# Patient Record
Sex: Female | Born: 1970
Health system: Southern US, Community
[De-identification: ages and names within clinical notes are randomized; demographics above are authoritative.]

## PROBLEM LIST (undated history)

## (undated) DIAGNOSIS — I8390 Asymptomatic varicose veins of unspecified lower extremity: Secondary | ICD-10-CM

## (undated) DIAGNOSIS — D252 Subserosal leiomyoma of uterus: Secondary | ICD-10-CM

## (undated) DIAGNOSIS — L80 Vitiligo: Secondary | ICD-10-CM

## (undated) DIAGNOSIS — R001 Bradycardia, unspecified: Secondary | ICD-10-CM

## (undated) HISTORY — DX: Subserosal leiomyoma of uterus: D25.2

## (undated) HISTORY — PX: WISDOM TOOTH EXTRACTION: SHX21

## (undated) HISTORY — PX: GANGLION CYST EXCISION: SHX1691

## (undated) HISTORY — DX: Vitiligo: L80

## (undated) HISTORY — DX: Bradycardia, unspecified: R00.1

## (undated) HISTORY — DX: Asymptomatic varicose veins of unspecified lower extremity: I83.90

---

## 2006-08-20 ENCOUNTER — Inpatient Hospital Stay (HOSPITAL_COMMUNITY): Admission: AD | Admit: 2006-08-20 | Discharge: 2006-08-20 | Payer: Self-pay | Admitting: Obstetrics and Gynecology

## 2006-08-20 ENCOUNTER — Inpatient Hospital Stay (HOSPITAL_COMMUNITY): Admission: AD | Admit: 2006-08-20 | Discharge: 2006-08-22 | Payer: Self-pay | Admitting: Obstetrics and Gynecology

## 2007-04-11 ENCOUNTER — Emergency Department (HOSPITAL_COMMUNITY): Admission: EM | Admit: 2007-04-11 | Discharge: 2007-04-11 | Payer: Self-pay | Admitting: Emergency Medicine

## 2007-10-27 ENCOUNTER — Ambulatory Visit: Payer: Self-pay | Admitting: Family Medicine

## 2008-03-28 ENCOUNTER — Ambulatory Visit: Payer: Self-pay | Admitting: Family Medicine

## 2009-03-17 ENCOUNTER — Ambulatory Visit: Payer: Self-pay | Admitting: Family Medicine

## 2009-04-06 ENCOUNTER — Ambulatory Visit (HOSPITAL_BASED_OUTPATIENT_CLINIC_OR_DEPARTMENT_OTHER): Admission: RE | Admit: 2009-04-06 | Discharge: 2009-04-06 | Payer: Self-pay | Admitting: Orthopedic Surgery

## 2009-04-06 ENCOUNTER — Encounter (INDEPENDENT_AMBULATORY_CARE_PROVIDER_SITE_OTHER): Payer: Self-pay | Admitting: Orthopedic Surgery

## 2010-05-07 ENCOUNTER — Ambulatory Visit (HOSPITAL_COMMUNITY): Admission: RE | Admit: 2010-05-07 | Discharge: 2010-05-07 | Payer: Self-pay | Admitting: Obstetrics and Gynecology

## 2010-05-10 ENCOUNTER — Encounter: Admission: RE | Admit: 2010-05-10 | Discharge: 2010-05-10 | Payer: Self-pay | Admitting: Internal Medicine

## 2010-11-24 LAB — POCT HEMOGLOBIN-HEMACUE: Hemoglobin: 13.3 g/dL (ref 12.0–15.0)

## 2011-01-01 NOTE — Op Note (Signed)
NAMESABAH, ZUCCO NO.:  [id number]   MEDICAL RECORD NO.:  [id number]          PATIENT TYPE:  AMB   LOCATION:  DSC                          FACILITY:  [facility]   PHYSICIAN:  Cindee Salt, M.D.       DATE OF BIRTH:  03-31-1971   DATE OF PROCEDURE:  04/06/2009  DATE OF DISCHARGE:                               OPERATIVE REPORT   PREOPERATIVE DIAGNOSIS:  Dorsal mass left wrist probable cyst.   POSTOPERATIVE DIAGNOSIS:  Dorsal mass left wrist probable cyst.   OPERATION:  Excision cystic mass left wrist.   SURGEON:  Cindee Salt, MD   ASSISTANT:  Carolyne Fiscal R.N.   ANESTHESIA:  Local with regional IV.   ANESTHESIOLOGIST:  Janetta Hora. Frederick, MD.   HISTORY:  The patient is a 40 year old female with a history of mass on  the dorsal aspect of her left wrist.  She is desirous having this  excised.  Pre, peri and postoperative course have been discussed along  with the risks and complication.  She is aware that there is no  guarantee with surgery, possibility of infection, recurrence injury to  arteries, nerves, tendons, incomplete relief of symptoms, and dystrophy.  Preoperative area the patient is seen.  The extremity marked by both the  patient and surgeon.  Antibiotic given.   PROCEDURE:  The patient was brought to the operating room where a  forearm based IV regional anesthetic was carried out without difficulty  under the direction of Dr. Gelene Mink.  She was prepped using ChloraPrep,  supine position, left arm free.  Three minute dry time was allowed and  time-out taken confirming the patient and procedure.  A long transverse  incision was made over the dorsal aspect of the left wrist third, fourth  dorsal compartment and carried down through subcutaneous tissue.  Extensor retinaculum was split and neurovascular structures protected.  Bleeders were electrocauterized with bipolar.  The cystic mass was  immediately apparent.  This was multilobulated.  With blunt and  sharp  dissection, it was dissected free and found to arise from the  scapholunate ligament area.  This area was opened and it was debrided  and there was a large polypoid type mass.  This was sent to pathology in  toto.  The wound was copiously irrigated with saline.  The capsule was  then repaired with figure-of-eight 4-0 Vicryl sutures suturing it back  down to the dorsal ligament.  The retinaculum was then repaired with  interrupted 4-0 Vicryl, the subcutaneous tissue with interrupted 4-0  Vicryl and the skin with a subcuticular 4-0 Vicryl Rapide suture.  A  local infiltration of 0.25% Marcaine was given.  Deflation of the  tourniquet all fingers immediately pinked.  A dorsal  compressive dressing and splint to the wrist only was applied.  The  patient tolerated the procedure well and was taken to the recovery room  for observation in satisfactory condition.  She will be discharged home  to return to the [facility] [place] in 1 week on Vicodin.  ______________________________  Cindee Salt, M.D.     GK/MEDQ  D:  04/06/2009  T:  04/06/2009  Job:  [id number]   cc:   Sharlot Gowda, M.D.

## 2011-01-04 NOTE — Discharge Summary (Signed)
NAMEKAMILLAH, Alexis Watts NO.:  [id number]   MEDICAL RECORD NO.:  [id number]          PATIENT TYPE:  INP   LOCATION:  9113                          FACILITY:  [facility]   PHYSICIAN:  Malachi Pro. Ambrose Mantle, M.D. DATE OF BIRTH:  1971-05-18   DATE OF ADMISSION:  08/20/2006  DATE OF DISCHARGE:  08/22/2005                               DISCHARGE SUMMARY   A 40 year old white married female para 2-0-0-2, gravida 3.  EDC  08/29/2006 by ultrasound, admitted in early labor.  Blood group and type  B positive, negative antibody, RPR nonreactive, rubella immune,  hepatitis B surface antigen negative, HIV negative.  One hour Glucola  110, group B strep negative.  Ultrasound at 9 weeks, [facility] 08/29/2006.  Ultrasound 04/01/2006, average gestational age [redacted] weeks 2 days, [facility]  08/24/2006.  The patient declined amnio.  Prenatal care was essentially  uncomplicated.   PAST MEDICAL HISTORY:  No known drug allergies, no operations.  She did  have a history of a slow heart rate.   FAMILY HISTORY:  Maternal grandmother with breast cancer.  Niece with  type 1 diabetes.   Alcohol, tobacco, and drugs:  None.   PAST OBSTETRIC HISTORY:  In May of 2003 and July of 2004, the patient  delivered vaginally, 7 pounds 0 ounces and 5 pounds 0 ounces, female and  female respectively.   On admission, her vital signs were normal.  Heart and lungs were normal.  The abdomen was soft.  Fundal height had been 36 cm on 08/13/2006.  Fetal heart tones were normal.  There were some possible late  decelerations.  Cervix was 4+ cm, 80-90%, vertex at a -2.   Artificial rupture of the membranes produced clear fluid.  The patient  progressed rapidly to full dilatation and delivered spontaneously OA  over an intact perineum by Dr. Ambrose Mantle a living female infant 6 pounds 9  ounces, Apgars of 9 at 1 and 9 at 5 minutes.  Placenta was intact.  Uterus normal.  Blood loss 400 cc.  Postpartum, the patient did well and  was discharged  on the second postpartum day.  RPR nonreactive.  Hemoglobin 13.1, hematocrit 38.3, white count 15,200, platelet count  172,000.  Followup hemoglobin 10.7.   FINAL DIAGNOSIS:  Intrauterine pregnancy at 39 weeks, delivered vertex.   OPERATIONS:  Spontaneous delivery vertex.   FINAL CONDITION:  Improved.   The patient is advised to return in 6 weeks.  She has a discharge  instruction booklet.  She declines analgesics at discharge.      Malachi Pro. Ambrose Mantle, M.D.  Electronically Signed     TFH/MEDQ  D:  08/22/2006  T:  08/22/2006  Job:  [id number]

## 2011-02-04 ENCOUNTER — Other Ambulatory Visit: Payer: Self-pay | Admitting: Obstetrics and Gynecology

## 2011-07-24 ENCOUNTER — Encounter (INDEPENDENT_AMBULATORY_CARE_PROVIDER_SITE_OTHER): Payer: BC Managed Care – PPO | Admitting: Physician Assistant

## 2011-07-24 DIAGNOSIS — M2240 Chondromalacia patellae, unspecified knee: Secondary | ICD-10-CM

## 2011-07-24 DIAGNOSIS — L259 Unspecified contact dermatitis, unspecified cause: Secondary | ICD-10-CM

## 2011-07-24 DIAGNOSIS — Z0000 Encounter for general adult medical examination without abnormal findings: Secondary | ICD-10-CM

## 2011-07-24 DIAGNOSIS — M25529 Pain in unspecified elbow: Secondary | ICD-10-CM

## 2011-10-25 ENCOUNTER — Encounter: Payer: Self-pay | Admitting: Family Medicine

## 2011-10-25 ENCOUNTER — Ambulatory Visit (INDEPENDENT_AMBULATORY_CARE_PROVIDER_SITE_OTHER): Payer: BC Managed Care – PPO | Admitting: Family Medicine

## 2011-10-25 VITALS — BP 112/71 | HR 59 | Temp 97.0°F | Resp 16 | Ht 66.5 in | Wt 133.0 lb

## 2011-10-25 DIAGNOSIS — Z111 Encounter for screening for respiratory tuberculosis: Secondary | ICD-10-CM

## 2011-10-25 DIAGNOSIS — Z0000 Encounter for general adult medical examination without abnormal findings: Secondary | ICD-10-CM | POA: Insufficient documentation

## 2011-10-25 DIAGNOSIS — R001 Bradycardia, unspecified: Secondary | ICD-10-CM | POA: Insufficient documentation

## 2011-10-25 NOTE — Progress Notes (Deleted)
  Subjective:    Patient ID: Alexis Watts, female    DOB: 01-08-1971, 41 y.o.   MRN: [id number]  HPI    Review of Systems     Objective:   Physical Exam        Assessment & Plan:

## 2011-10-25 NOTE — Progress Notes (Signed)
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 She had a CPE in December 2012. Her overall physical status has not changed. Her Immunizations are up-to-date.  Her short white PE exam is completed and will be scanned into the record. She understands that she needs to RTC at [address] to have PPD read in 48-72 hours.

## 2011-10-27 ENCOUNTER — Encounter (INDEPENDENT_AMBULATORY_CARE_PROVIDER_SITE_OTHER): Payer: BC Managed Care – PPO

## 2011-10-27 DIAGNOSIS — Z0389 Encounter for observation for other suspected diseases and conditions ruled out: Secondary | ICD-10-CM

## 2011-10-27 LAB — TB SKIN TEST: TB Skin Test: NEGATIVE mm

## 2012-04-01 ENCOUNTER — Ambulatory Visit (INDEPENDENT_AMBULATORY_CARE_PROVIDER_SITE_OTHER): Payer: BC Managed Care – PPO | Admitting: Physician Assistant

## 2012-04-01 VITALS — BP 122/75 | HR 50 | Temp 98.1°F | Resp 16 | Ht 66.5 in | Wt 135.0 lb

## 2012-04-01 DIAGNOSIS — L0100 Impetigo, unspecified: Secondary | ICD-10-CM

## 2012-04-01 MED ORDER — MUPIROCIN CALCIUM 2 % EX CREA: TOPICAL_CREAM | Freq: Three times a day (TID) | CUTANEOUS | Status: AC

## 2012-04-01 MED ORDER — DOXYCYCLINE HYCLATE 100 MG PO CAPS: 100.0000 mg | ORAL_CAPSULE | Freq: Two times a day (BID) | ORAL | Status: AC

## 2012-04-01 NOTE — Progress Notes (Signed)
  Subjective:    Patient ID: Alexis Watts, female    DOB: 10/29/1970, 41 y.o.   MRN: [id number]  HPI 41 year old female presents with 1 week history of lesion on her chin that has been pruritic, draining yellow pus, and also tender to touch. Has been keeping it covered but today when a second lesion appeared next to it decided to have them evaluated.  She has put neosporin on them once or twice but has not used anything consistently.  No fevers, chills, or warmth.     Review of Systems  All other systems reviewed and are negative.       Objective:   Physical Exam  Constitutional: She is oriented to person, place, and time. She appears well-developed and well-nourished.  HENT:  Head: Normocephalic and atraumatic.  Right Ear: External ear normal.  Left Ear: External ear normal.  Eyes: Conjunctivae are normal.  Neck: Normal range of motion.  Cardiovascular: Normal rate, regular rhythm and normal heart sounds.   Pulmonary/Chest: Effort normal and breath sounds normal.  Neurological: She is alert and oriented to person, place, and time.  Skin:     Psychiatric: She has a normal mood and affect. Her behavior is normal. Judgment and thought content normal.          Assessment & Plan:   1. Impetigo  doxycycline (VIBRAMYCIN) 100 MG capsule, mupirocin cream (BACTROBAN) 2 %, Wound culture   Will treat with Mupirocin cream tid x 7 days. If no noticeable improvement in 48 hours, start doxycycline for MRSA coverage. Keep wound covered. Wash with soap and water.

## 2012-04-05 LAB — WOUND CULTURE
Gram Stain: NONE SEEN
Gram Stain: NONE SEEN
Gram Stain: NONE SEEN

## 2012-04-06 ENCOUNTER — Other Ambulatory Visit: Payer: Self-pay | Admitting: Physician Assistant

## 2012-04-06 MED ORDER — AMOXICILLIN 875 MG PO TABS: 875.0000 mg | ORAL_TABLET | Freq: Two times a day (BID) | ORAL | Status: AC

## 2012-11-11 ENCOUNTER — Ambulatory Visit (INDEPENDENT_AMBULATORY_CARE_PROVIDER_SITE_OTHER): Payer: BC Managed Care – PPO | Admitting: Physician Assistant

## 2012-11-11 VITALS — BP 120/74 | HR 59 | Temp 98.8°F | Resp 16 | Ht 67.0 in | Wt 131.4 lb

## 2012-11-11 DIAGNOSIS — M549 Dorsalgia, unspecified: Secondary | ICD-10-CM

## 2012-11-11 LAB — POCT URINALYSIS DIPSTICK
Bilirubin, UA: NEGATIVE
Glucose, UA: NEGATIVE
Ketones, UA: NEGATIVE
Leukocytes, UA: NEGATIVE
Protein, UA: NEGATIVE
Spec Grav, UA: 1.015

## 2012-11-11 LAB — POCT UA - MICROSCOPIC ONLY
Mucus, UA: NEGATIVE
Yeast, UA: NEGATIVE

## 2012-11-11 NOTE — Progress Notes (Signed)
[address] [place] [address]   Phone [phone]  Subjective:    Patient ID: Alexis Watts, female    DOB: [birth year], 42 y.o.   MRN: [id number]  HPI  Pt presents to clinic with mid-low back pain for the last couple of months.  It only bothers her only at night and she has noticed that she wakes up feeling like she has to go to the bathroom but when she tries she does not have to go and then she has trouble getting comfortable to go back to sleep.  She is very active during the day with running and yoga, she has not had to change her activity due to her discomfort.  She has noticed that when she is less active her discomfort at night is worse.  She has also noticed that her pain is worse around her menses.  She is having no vaginal discharge, no change in recent sexual partners.  She has some urinary incontinence but that has not changed since after her 1st vaginal delivery 11 years ago.  She is not having pain with sex.  She has h/o fibroids prior to her pregnancies, oldest child is 11 years ago but has not had problems with them since.  She saw her GYN a year ago and it is time for her yearly exam.  She is having regular menses without increased flow or cramping, she is noticed increase PMS.  She has tried motrin at night which does help but it makes her really sleepy so she cannot take it during the day.  When she pushes on her back she has 2 distinct areas, one on each side where the pain is worse.  Her mattress is not new but it is firm. She has no pain radiation and it is dull not sharp.  Review of Systems  Constitutional: Negative for fever and chills.  Genitourinary: Negative for dysuria, urgency, frequency, vaginal discharge, vaginal pain and menstrual problem.  Musculoskeletal: Positive for back pain. Negative for myalgias, arthralgias and gait problem.       Objective:   Physical Exam  Vitals reviewed. Constitutional: She is oriented to person, place, and time. She appears  well-developed and well-nourished.  HENT:  Head: Normocephalic and atraumatic.  Right Ear: External ear normal.  Left Ear: External ear normal.  Eyes: Conjunctivae are normal.  Cardiovascular: Normal rate, regular rhythm and normal heart sounds.   No murmur heard. Pulmonary/Chest: Effort normal and breath sounds normal.  Abdominal: Soft. She exhibits no mass. There is no tenderness. There is no CVA tenderness.  Musculoskeletal:       Thoracic back: She exhibits spasm (palpable). She exhibits no tenderness and no bony tenderness.       Back:  No back pain with strength testing of legs.  Neurological: She is alert and oriented to person, place, and time. She has normal strength. No cranial nerve deficit or sensory deficit.  Reflex Scores:      Patellar reflexes are 2+ on the right side and 2+ on the left side.      Achilles reflexes are 2+ on the right side and 2+ on the left side. Skin: Skin is warm and dry.  Psychiatric: She has a normal mood and affect. Her behavior is normal. Judgment and thought content normal.   Results for orders placed in visit on 11/11/12  POCT URINALYSIS DIPSTICK      Result Value Range   Color, UA yellow     Clarity, UA  clear     Glucose, UA neg     Bilirubin, UA neg     Ketones, UA neg     Spec Grav, UA 1.015     Blood, UA neg     pH, UA 6.0     Protein, UA neg     Urobilinogen, UA 0.2     Nitrite, UA neg     Leukocytes, UA Negative    POCT UA - MICROSCOPIC ONLY      Result Value Range   WBC, Ur, HPF, POC 0-1     RBC, urine, microscopic 0-1     Bacteria, U Microscopic small     Mucus, UA neg     Epithelial cells, urine per micros 1-4     Crystals, Ur, HPF, POC neg     Casts, Ur, LPF, POC neg     Yeast, UA neg          Assessment & Plan:  Back pain - It is most likely this is muscular in origin since it is better after stretching, better with motrin and seems to only affect the patient when she is sleeping, I think because she is getting  stiff.  I would suggest that the patient make an appt with her GYN because I do think it is possible that maybe her fibroids are playing a role in her discomfort due to the fact that it is worse around her menses.  I was not able to feel a palpable uterine mass but she needs a pelvic exam and possibly a pelvic US to evaluate and she would like to see her GYN for that.  She has a normal urine today.  She will continue to stay hydrate and work on back stretches and add heat to help with blood flow to the area.  Plan: POCT urinalysis dipstick, POCT UA - Microscopic Only.  She will f/u if her symptoms change or get worse.

## 2012-11-19 ENCOUNTER — Encounter: Payer: BC Managed Care – PPO | Admitting: Obstetrics and Gynecology

## 2012-11-27 ENCOUNTER — Ambulatory Visit (INDEPENDENT_AMBULATORY_CARE_PROVIDER_SITE_OTHER): Payer: BC Managed Care – PPO | Admitting: Obstetrics and Gynecology

## 2012-11-27 ENCOUNTER — Encounter: Payer: Self-pay | Admitting: Obstetrics and Gynecology

## 2012-11-27 VITALS — BP 100/60 | Ht 66.0 in | Wt 130.0 lb

## 2012-11-27 DIAGNOSIS — Z01419 Encounter for gynecological examination (general) (routine) without abnormal findings: Secondary | ICD-10-CM

## 2012-11-27 DIAGNOSIS — N9489 Other specified conditions associated with female genital organs and menstrual cycle: Secondary | ICD-10-CM

## 2012-11-27 NOTE — Progress Notes (Signed)
Patient ID: Alexis Watts, female   DOB: 22-Jan-1971, 42 y.o.   MRN: [id number]  42 y.o.  Married  Caucasian female   [phone] here for annual exam.    Low back pain for 3 1/2 months.  Can wake patient up.  Worse just prior to menses.   Saw PCP, had normal UA and blood work.  Told to see GYN next.  No imaging studies.  Does not have pain with exercise.    Menses regular.  Feels hormonally more intense.  Feels pelvic and leg heaviness during menses.  Has varicose veins on the back of legs which are also more sensitive during the first two days of cycles.  No cramping of low back pain during cycles.    No change in bowel function.  No dysuria or difficulty with voiding.    Patient's last menstrual period was 11/16/2012.          Sexually active: yes  The current method of family planning is vasectomy.    Exercising: yoga, running [facility]. Does self breast exams Last pap smear: 1 year ago History of abnormal pap: no Smoking: no Alcohol: no Last colonoscopy:  8 years ago done due to blood in stool.  No further bleeding. Told to do at age 24.   Last Bone Density:   Last tetanus shot: Last cholesterol check: Does labs with PCP     Health Maintenance  Topic Date Due  . Tetanus/tdap  11/05/1988  . Influenza Vaccine  04/19/2013  . Pap Smear  02/03/2014    Family History  Problem Relation Age of Onset  . Breast cancer Maternal Grandmother     Patient Active Problem List  Diagnosis  . Health care maintenance  . Bradycardia, sinus    Past Medical History  Diagnosis Date  . Bradycardia     Past Surgical History  Procedure Laterality Date  . Ganglion cyst excision    . Ganglion cyst excision Left     Allergies: Review of patient's allergies indicates no known allergies.  Current Outpatient Prescriptions  Medication Sig Dispense Refill  . ibuprofen (ADVIL,MOTRIN) 200 MG tablet Take 200 mg by mouth every 6 (six) hours as needed for  pain.       No current facility-administered medications for this visit.    ROS: Pertinent items are noted in HPI.  Social Hx:    Exam:    BP 100/60  Ht 5\' 6"  (1.676 m)  Wt 130 lb (58.968 kg)  BMI 20.99 kg/m2  LMP 11/16/2012   Wt Readings from Last 3 Encounters:  11/27/12 130 lb (58.968 kg)  11/11/12 131 lb 6.4 oz (59.603 kg)  04/01/12 135 lb (61.236 kg)     Ht Readings from Last 3 Encounters:  11/27/12 5\' 6"  (1.676 m)  11/11/12 5\' 7"  (1.702 m)  04/01/12 5' 6.5" (1.689 m)    General appearance: alert, cooperative and appears stated age Head: Normocephalic, without obvious abnormality, atraumatic Neck: no adenopathy, supple, symmetrical, trachea midline and thyroid not enlarged, symmetric, no tenderness/mass/nodules Lungs: clear to auscultation bilaterally Breasts: Inspection negative, No nipple retraction or dimpling, No nipple discharge or bleeding, No axillary or supraclavicular adenopathy, Normal to palpation without dominant masses Heart: regular rate and rhythm Abdomen: soft, non-tender; bowel sounds normal; no masses,  no organomegaly Extremities: extremities normal, atraumatic, no cyanosis or edema Skin: Skin color, texture, turgor normal. No rashes or lesions Lymph nodes: Cervical, supraclavicular, and axillary nodes normal. No  abnormal inguinal nodes palpated Neurologic: Grossly normal   Pelvic: External genitalia:  no lesions              Urethra:  normal appearing urethra with no masses, tenderness or lesions              Bartholins and Skenes: normal                 Vagina: normal appearing vagina with normal color and discharge, no lesions              Cervix: normal appearance              Pap taken: yes and HR HPV testing.        Bimanual Exam:  Uterus:  uterus is normal size, shape, consistency and nontender                                      Adnexa: normal adnexa in size, nontender and no masses.  Right adnexal fullness, nontender.                                       Rectovaginal: Confirms                                      Anus:  normal sphincter tone, no lesions  A: normal gyn exam Back pain. Possible right adnexal mass     P: [facility]  And HR HPV testing Return for pelvic ultrasound Ibuprophen prn return annually or prn     An After Visit Summary was printed and given to the patient.

## 2012-11-27 NOTE — Patient Instructions (Signed)

## 2012-12-09 ENCOUNTER — Ambulatory Visit (INDEPENDENT_AMBULATORY_CARE_PROVIDER_SITE_OTHER): Payer: BC Managed Care – PPO

## 2012-12-09 ENCOUNTER — Ambulatory Visit (INDEPENDENT_AMBULATORY_CARE_PROVIDER_SITE_OTHER): Payer: BC Managed Care – PPO | Admitting: Obstetrics and Gynecology

## 2012-12-09 ENCOUNTER — Encounter: Payer: Self-pay | Admitting: Obstetrics and Gynecology

## 2012-12-09 VITALS — BP 120/62

## 2012-12-09 DIAGNOSIS — N949 Unspecified condition associated with female genital organs and menstrual cycle: Secondary | ICD-10-CM

## 2012-12-09 DIAGNOSIS — D219 Benign neoplasm of connective and other soft tissue, unspecified: Secondary | ICD-10-CM

## 2012-12-09 DIAGNOSIS — N831 Corpus luteum cyst of ovary, unspecified side: Secondary | ICD-10-CM

## 2012-12-09 DIAGNOSIS — N9489 Other specified conditions associated with female genital organs and menstrual cycle: Secondary | ICD-10-CM

## 2012-12-09 DIAGNOSIS — R102 Pelvic and perineal pain: Secondary | ICD-10-CM

## 2012-12-09 DIAGNOSIS — D259 Leiomyoma of uterus, unspecified: Secondary | ICD-10-CM

## 2012-12-09 NOTE — Patient Instructions (Addendum)
Uterine Fibroid A uterine fibroid is a growth (tumor) that occurs in a woman's uterus. This type of tumor is not cancerous and does not spread out of the uterus. A woman can have one or many fibroids, and the fiboid(s) can become quite large. A fibroid can vary in size, weight, and where it grows in the uterus. Most fibroids do not require medical treatment, but some can cause pain or heavy bleeding during and between periods. CAUSES  A fibroid is the result of a single uterine cell that keeps growing (unregulated), which is different than most cells in the human body. Most cells have a control mechanism that keeps them from reproducing without control.  SYMPTOMS   Bleeding.  Pelvic pain and pressure.  Bladder problems due to the size of the fibroid.  Infertility and miscarriages depending on the size and location of the fibroid. DIAGNOSIS  A diagnosis is made by physical exam. Your caregiver may feel the lumpy tumors during a pelvic exam. Important information regarding size, location, and number of tumors can be gained by having an ultrasound. It is rare that other tests, such as a CT scan or MRI, are needed. TREATMENT   Your caregiver may recommend watchful waiting. This involves getting the fibroid checked by your caregiver to see if the fibroids grow or shrink.   Hormonal treatment or an intrauterine device (IUD) may be prescribed.   Surgery may be needed to remove the fibroids (myomectomy) or the uterus (hysterectomy). This depends on your situation. When fibroids interfere with fertility and a woman wants to become pregnant, a caregiver may recommend having the fibroids removed.  HOME CARE INSTRUCTIONS  Home care depends on how you were treated. In general:   Keep all follow-up appointments with your caregiver.   Only take medicine as told by your caregiver. Do not take aspirin. It can cause bleeding.   If you have excessive periods and soak tampons or pads in a half hour or  less, contact your caregiver immediately. If your periods are troublesome but not so heavy, lie down with your feet raised slightly above your heart. Place cold packs on your lower abdomen.   If your periods are heavy, write down the number of pads or tampons you use per month. Bring this information to your caregiver.   Talk to your caregiver about taking iron pills.   Include green vegetables in your diet.   If you were prescribed a hormonal treatment, take the hormonal medicines as directed.   If you need surgery, ask your caregiver for information on your specific surgery.  SEEK IMMEDIATE MEDICAL CARE IF:  You have pelvic pain or cramps not controlled with medicines.   You have a sudden increase in pelvic pain.   You have an increase of bleeding between and during periods.   You feel lightheaded or have fainting episodes.  MAKE SURE YOU:  Understand these instructions.  Will watch your condition.  Will get help right away if you are not doing well or get worse. Document Released: 08/02/2000 Document Revised: 10/28/2011 Document Reviewed: 08/26/2011 [facility] Patient Information 2013 [place], [place].  Ovarian Cyst The ovaries are small organs that are on each side of the uterus. The ovaries are the organs that produce the female hormones, estrogen and progesterone. An ovarian cyst is a sac filled with fluid that can vary in its size. It is normal for a small cyst to form in women who are in the childbearing age and who have menstrual periods.  This type of cyst is called a follicle cyst that becomes an ovulation cyst (corpus luteum cyst) after it produces the women's egg. It later goes away on its own if the woman does not become pregnant. There are other kinds of ovarian cysts that may cause problems and may need to be treated. The most serious problem is a cyst with cancer. It should be noted that menopausal women who have an ovarian cyst are at a higher risk of it being  a cancer cyst. They should be evaluated very quickly, thoroughly and followed closely. This is especially true in menopausal women because of the high rate of ovarian cancer in women in menopause. CAUSES AND TYPES OF OVARIAN CYSTS:  FUNCTIONAL CYST: The follicle/corpus luteum cyst is a functional cyst that occurs every month during ovulation with the menstrual cycle. They go away with the next menstrual cycle if the woman does not get pregnant. Usually, there are no symptoms with a functional cyst.  ENDOMETRIOMA CYST: This cyst develops from the lining of the uterus tissue. This cyst gets in or on the ovary. It grows every month from the bleeding during the menstrual period. It is also called a "chocolate cyst" because it becomes filled with blood that turns brown. This cyst can cause pain in the lower abdomen during intercourse and with your menstrual period.  CYSTADENOMA CYST: This cyst develops from the cells on the outside of the ovary. They usually are not cancerous. They can get very big and cause lower abdomen pain and pain with intercourse. This type of cyst can twist on itself, cut off its blood supply and cause severe pain. It also can easily rupture and cause a lot of pain.  DERMOID CYST: This type of cyst is sometimes found in both ovaries. They are found to have different kinds of body tissue in the cyst. The tissue includes skin, teeth, hair, and/or cartilage. They usually do not have symptoms unless they get very big. Dermoid cysts are rarely cancerous.  POLYCYSTIC OVARY: This is a rare condition with hormone problems that produces many small cysts on both ovaries. The cysts are follicle-like cysts that never produce an egg and become a corpus luteum. It can cause an increase in body weight, infertility, acne, increase in body and facial hair and lack of menstrual periods or rare menstrual periods. Many women with this problem develop type 2 diabetes. The exact cause of this problem is  unknown. A polycystic ovary is rarely cancerous.  THECA LUTEIN CYST: Occurs when too much hormone (human chorionic gonadotropin) is produced and over-stimulates the ovaries to produce an egg. They are frequently seen when doctors stimulate the ovaries for invitro-fertilization (test tube babies).  LUTEOMA CYST: This cyst is seen during pregnancy. Rarely it can cause an obstruction to the birth canal during labor and delivery. They usually go away after delivery. SYMPTOMS   Pelvic pain or pressure.  Pain during sexual intercourse.  Increasing girth (swelling) of the abdomen.  Abnormal menstrual periods.  Increasing pain with menstrual periods.  You stop having menstrual periods and you are not pregnant. DIAGNOSIS  The diagnosis can be made during:  Routine or annual pelvic examination (common).  Ultrasound.  X-ray of the pelvis.  CT Scan.  MRI.  Blood tests. TREATMENT   Treatment may only be to follow the cyst monthly for 2 to 3 months with your caregiver. Many go away on their own, especially functional cysts.  May be aspirated (drained) with a long needle with  ultrasound, or by laparoscopy (inserting a tube into the pelvis through a small incision).  The whole cyst can be removed by laparoscopy.  Sometimes the cyst may need to be removed through an incision in the lower abdomen.  Hormone treatment is sometimes used to help dissolve certain cysts.  Birth control pills are sometimes used to help dissolve certain cysts. HOME CARE INSTRUCTIONS  Follow your caregiver's advice regarding:  Medicine.  Follow up visits to evaluate and treat the cyst.  You may need to come back or make an appointment with another caregiver, to find the exact cause of your cyst, if your caregiver is not a gynecologist.  Get your yearly and recommended pelvic examinations and Pap tests.  Let your caregiver know if you have had an ovarian cyst in the past. SEEK MEDICAL CARE IF:   Your  periods are late, irregular, they stop, or are painful.  Your stomach (abdomen) or pelvic pain does not go away.  Your stomach becomes larger or swollen.  You have pressure on your bladder or trouble emptying your bladder completely.  You have painful sexual intercourse.  You have feelings of fullness, pressure, or discomfort in your stomach.  You lose weight for no apparent reason.  You feel generally ill.  You become constipated.  You lose your appetite.  You develop acne.  You have an increase in body and facial hair.  You are gaining weight, without changing your exercise and eating habits.  You think you are pregnant. SEEK IMMEDIATE MEDICAL CARE IF:   You have increasing abdominal pain.  You feel sick to your stomach (nausea) and/or vomit.  You develop a fever that comes on suddenly.  You develop abdominal pain during a bowel movement.  Your menstrual periods become heavier than usual. Document Released: 08/05/2005 Document Revised: 10/28/2011 Document Reviewed: 06/08/2009 [facility] Patient Information 2013 [place], [place].

## 2012-12-09 NOTE — Progress Notes (Signed)
Subjective  Patient here for pelvic ultrasound.  Has been having low back pain when lying down.  On recent pelvic exam had a fullness in the right adnexa.  Objective  See ultrasound report below.      Assessment  Resolving right corpus luteum cyst. Small uterine fibroid. Back pain.  Plan  No further gynecologic work up needed. Proceed with orthopedic workup.

## 2013-07-18 ENCOUNTER — Ambulatory Visit (INDEPENDENT_AMBULATORY_CARE_PROVIDER_SITE_OTHER): Payer: BC Managed Care – PPO | Admitting: Physician Assistant

## 2013-07-18 VITALS — BP 108/62 | HR 67 | Temp 98.3°F | Resp 16 | Ht 67.0 in | Wt 133.2 lb

## 2013-07-18 DIAGNOSIS — J069 Acute upper respiratory infection, unspecified: Secondary | ICD-10-CM

## 2013-07-18 MED ORDER — HYDROCODONE-HOMATROPINE 5-1.5 MG/5ML PO SYRP: 5.0000 mL | ORAL_SOLUTION | Freq: Three times a day (TID) | ORAL | Status: DC | PRN

## 2013-07-18 MED ORDER — MUCINEX DM MAXIMUM STRENGTH 60-1200 MG PO TB12: 1.0000 | ORAL_TABLET | Freq: Two times a day (BID) | ORAL | Status: DC

## 2013-07-18 NOTE — Progress Notes (Signed)
   Subjective:    Patient ID: Alexis Watts, female    DOB: 1971/05/09, 42 y.o.   MRN: [id number]  HPI Pt presents to clinic with 1 week h/o cold symptoms that started with a sore throat and congestion.  The sore throat is now irritation from the cough and the congestion is much better though she will sometimes get out green and yellow rhinorrhea.  She is mainly here because though she is feeling much better her cough is keeping her up at night.  It is a tickle in her throat that once it starts she cannot stop it.  She has tried multiple OTC meds which are really not helping.  She has some old Tussionex at home but it is not even helping she thinks it is >62year old.  OTC - cold preps -  Review of Systems  Constitutional: Negative for fever and chills.  HENT: Positive for congestion, rhinorrhea (green and yellow) and sore throat (now resolved).   Respiratory: Positive for cough (dry cough - productive in the am).        No h/o asthma  Neurological: Positive for headaches (from coughing).       Objective:   Physical Exam  Vitals reviewed. Constitutional: She is oriented to person, place, and time. She appears well-developed and well-nourished.  HENT:  Head: Normocephalic and atraumatic.  Right Ear: Hearing, tympanic membrane, external ear and ear canal normal.  Left Ear: Hearing, tympanic membrane, external ear and ear canal normal.  Nose: Mucosal edema (red) present.  Mouth/Throat: Uvula is midline, oropharynx is clear and moist and mucous membranes are normal.  Eyes: Conjunctivae are normal.  Neck: Normal range of motion.  Cardiovascular: Normal rate, regular rhythm and normal heart sounds.   Pulmonary/Chest: Effort normal and breath sounds normal. She has no wheezes.  Lymphadenopathy:    She has cervical adenopathy (AC enlareged and slightly TTP).  Neurological: She is alert and oriented to person, place, and time.  Skin: Skin is warm and dry.  Psychiatric: She has a normal mood  and affect. Her behavior is normal. Judgment and thought content normal.       Assessment & Plan:  Viral URI with cough - Plan: HYDROcodone-homatropine (HYCODAN) 5-1.5 MG/5ML syrup, Dextromethorphan-Guaifenesin (MUCINEX DM MAXIMUM STRENGTH) 60-1200 MG TB12  Push fluids - tylenol motrin for feeling poorly.  She will call in 1 week if the cough is improved but the congestion and PND continues for coverage of a sinus infection.  Benny Lennert PA-C 07/18/2013 8:43 AM

## 2013-08-02 ENCOUNTER — Emergency Department (INDEPENDENT_AMBULATORY_CARE_PROVIDER_SITE_OTHER): Payer: BC Managed Care – PPO

## 2013-08-02 ENCOUNTER — Emergency Department
Admission: EM | Admit: 2013-08-02 | Discharge: 2013-08-02 | Disposition: A | Discharge status: Home / Self Care | Payer: BC Managed Care – PPO | Source: Home / Self Care | Attending: Family Medicine | Admitting: Family Medicine

## 2013-08-02 ENCOUNTER — Encounter: Payer: Self-pay | Admitting: Emergency Medicine

## 2013-08-02 DIAGNOSIS — R05 Cough: Secondary | ICD-10-CM

## 2013-08-02 DIAGNOSIS — R059 Cough, unspecified: Secondary | ICD-10-CM

## 2013-08-02 LAB — POCT CBC W AUTO DIFF (K'VILLE URGENT CARE)

## 2013-08-02 NOTE — ED Notes (Signed)
Pt c/o cough, chest congestion, and swollen lymph nodes in her neck x 3 wks. She reports that she saw her PCP 2 wks ago was dx with viral illness and was given codeine cough med, which helped with the cough at night. Denies fever.

## 2013-08-02 NOTE — ED Provider Notes (Signed)
CSN: [id number]     Arrival date & time 08/02/13  1235 History   First MD Initiated Contact with Patient 08/02/13 1305     Chief Complaint  Patient presents with  . Cough      HPI Comments: Patient reports that she developed a URI about 3 weeks ago, and was diagnosed with a viral URI two weeks ago.  She has improved significantly and feels much better, but still has a non-productive cough.  No pleuritic pain.  No shortness of breath                                                                                                                                                                                                                     The history is provided by the patient.    Past Medical History  Diagnosis Date  . Bradycardia    Past Surgical History  Procedure Laterality Date  . Ganglion cyst excision    . Ganglion cyst excision Left    Family History  Problem Relation Age of Onset  . Breast cancer Maternal Grandmother    History  Substance Use Topics  . Smoking status: Never Smoker   . Smokeless tobacco: Not on file  . Alcohol Use: No   OB History   Grav Para Term Preterm Abortions TAB SAB Ect Mult Living   3 3 3       3      Review of Systems No sore throat + cough No pleuritic pain No wheezing No nasal congestion No post-nasal drainage No sinus pain/pressure No itchy/red eyes No earache No hemoptysis No SOB No fever/chills No nausea No vomiting No abdominal pain No diarrhea No urinary symptoms No skin rash No fatigue No myalgias No headache Used OTC meds without relief  Allergies  Review of patient's allergies indicates no known allergies.  Home Medications   Current Outpatient Rx  Name  Route  Sig  Dispense  Refill  . Dextromethorphan-Guaifenesin (MUCINEX DM MAXIMUM STRENGTH) 60-1200 MG TB12   Oral   Take 1 tablet by mouth 2 (two) times daily.   14 each   0   . HYDROcodone-homatropine (HYCODAN) 5-1.5 MG/5ML syrup   Oral   Take 5  mLs by mouth every 8 (eight) hours as needed for cough.   120 mL   0   . ibuprofen (ADVIL,MOTRIN) 200 MG tablet   Oral   Take 200 mg by mouth every 6 (six) hours as needed for pain.  BP 114/75  Pulse 71  Temp(Src) 98 F (36.7 C) (Oral)  Resp 16  Ht 5\' 6"  (1.676 m)  Wt 133 lb (60.328 kg)  BMI 21.48 kg/m2  SpO2 100%  LMP 08/01/2013 Physical Exam Nursing notes and Vital Signs reviewed. Appearance:  Patient appears healthy, stated age, and in no acute distress Eyes:  Pupils are equal, round, and reactive to light and accomodation.  Extraocular movement is intact.  Conjunctivae are not inflamed  Ears:  Canals normal.  Tympanic membranes normal.  Nose:  Mildly congested turbinates.  No sinus tenderness.   Pharynx:  Normal Neck:  Supple.  Slightly tender shotty posterior nodes are palpated bilaterally  Lungs:  Clear to auscultation.  Breath sounds are equal.  Chest:  Mild tenderness to palpation over the mid-sternum. Heart:  Regular rate and rhythm without murmurs, rubs, or gallops.  Abdomen:  Nontender without masses or hepatosplenomegaly.  Bowel sounds are present.  No CVA or flank tenderness.  Extremities:  No edema.  No calf tenderness Skin:  No rash present.   ED Course  Procedures  none    Labs Reviewed  POCT CBC W AUTO DIFF ([facility])  WBC 7.3; LY 23.3; MO 9.1; GR 67.6; Hgb 12.7; Platelets 196    Imaging Review Dg Chest 2 View  08/02/2013   CLINICAL DATA:  Cough and congestion.  EXAM: CHEST  2 VIEW  COMPARISON:  None.  FINDINGS: The heart size is normal. Though it lungs are clear. The visualized soft tissues and bony thorax are unremarkable. Mild leftward curvature is present in the upper thoracic spine.  IMPRESSION: No acute cardiopulmonary disease or significant interval change.   Electronically Signed   By: Gennette Pac M.D.   On: 08/02/2013 13:39      MDM   1. Cough; suspect post-infectious (resolving URI)    Take plain Mucinex (1200 mg  guaifenesin) twice daily for cough and congestion.  Increase fluid intake. May take Delsym Cough Suppressant at bedtime for nighttime cough.   Followup with Family Doctor if not improved in one week.     Lattie Haw, MD 08/02/13 [phone]

## 2013-10-11 ENCOUNTER — Emergency Department
Admission: EM | Admit: 2013-10-11 | Discharge: 2013-10-11 | Disposition: A | Discharge status: Home / Self Care | Payer: BC Managed Care – PPO | Source: Home / Self Care | Attending: Family Medicine | Admitting: Family Medicine

## 2013-10-11 ENCOUNTER — Encounter: Payer: Self-pay | Admitting: Emergency Medicine

## 2013-10-11 DIAGNOSIS — K144 Atrophy of tongue papillae: Secondary | ICD-10-CM

## 2013-10-11 LAB — POCT CBC W AUTO DIFF (K'VILLE URGENT CARE)

## 2013-10-11 MED ORDER — TRIAMCINOLONE ACETONIDE 0.1 % MT PSTE: PASTE | OROMUCOSAL | Status: DC

## 2013-10-11 NOTE — Discharge Instructions (Signed)
Recommend daily multi-vitamin.   Glossitis Glossitis is an inflammation of the tongue. Changes in the appearance of the tongue may be a primary tongue disorder. This means the problem is only in the tongue. Glossitis may be a symptom of other disorders. CAUSES   Excessive alcohol.  Multiple allergies.  Infections.  Tobacco and nicotine use.  Anemia.  Mechanical injury.  Spicy foods.  Vitamin B deficiency.  Damage from chemicals or hot food or drink. SYMPTOMS  There may be swelling and color changes in the tongue. Sometimes the surface of the tongue may look smooth. This disorder may be painless. But the tongue is usually sore and tender. It can be fiery red if condition is caused by deficiency of B vitamins. It is sometimes pale if there is anemia. Anemia means there are not enough red blood cells. There may be problems with chewing, swallowing and speaking. In some cases, glossitis may result in severe tongue swelling that blocks the airway. DIAGNOSIS  The diagnosis of glossitis is made easily by physical exam and asking for a history. Sometimes blood tests may be done. TREATMENT   The goal of treatment is to reduce inflammation. Hospitalization is usually not necessary unless tongue swelling is severe.  Good oral hygiene is important. This means good tooth brushing at least twice a day, and flossing daily for treatment and prevention.  Corticosteroids such as prednisone may be given to reduce the redness and soreness.  Medications may be prescribed if the cause of glossitis is an infection.  Other problems such as anemia and nutritional deficiencies are treated. This may be a dietary change or vitamin supplements. Avoid hot or spicy foods, alcohol, and tobacco. This lessens the discomfort.  Avoid anything that is irritating to your mouth or tongue. Glossitis usually responds well to treatment if the cause of it is removed or treated.  SEEK IMMEDIATE MEDICAL CARE IF:    Symptoms of glossitis persist for longer than 10 days.  Tongue swelling is severe and breathing, speaking, chewing, or swallowing difficulties are present.  You have no relief from medications given.  You develop difficulties breathing. Document Released: 07/26/2002 Document Revised: 10/28/2011 Document Reviewed: 09/09/2008 [facility] Patient Information 2014 [place].

## 2013-10-11 NOTE — ED Provider Notes (Addendum)
CSN: [id number]     Arrival date & time 10/11/13  1343 History   First MD Initiated Contact with Patient 10/11/13 1506     Chief Complaint  Patient presents with  . Oral Swelling    sore on tongue  . Otalgia        HPI Comments: Patient complains of approximately 6 month history of a sore, denuded area on the right anterior tip of her tongue that has been gradually increasing in size.  She has decreased taste, and the area is sensitive to spicy foods, salt, and sweet.  Over the past two weeks she has had vague pain in her right ear, present only at night.  She feels well otherwise. She notes that she sleeps with her mouth open at night, often with uncomfortable resulting dryness of her oropharynx.  Patient is a 43 y.o. female presenting with mouth sores. The history is provided by the patient.  Mouth Lesions Location:  Tongue Quality:  Painful Pain details:    Quality:  Burning   Severity:  Mild   Duration:  6 months   Timing:  Constant   Progression:  Worsening Onset quality:  Gradual Severity:  Mild Duration:  6 months Progression:  Unchanged Chronicity:  New Relieved by:  Nothing Worsened by:  Eating and drinking Ineffective treatments:  None tried Associated symptoms: ear pain   Associated symptoms: no congestion, no dental pain, no fever, no malaise, no neck pain, no rash, no rhinorrhea, no sore throat and no swollen glands     Past Medical History  Diagnosis Date  . Bradycardia    Past Surgical History  Procedure Laterality Date  . Ganglion cyst excision    . Ganglion cyst excision Left    Family History  Problem Relation Age of Onset  . Breast cancer Maternal Grandmother    History  Substance Use Topics  . Smoking status: Never Smoker   . Smokeless tobacco: Never Used  . Alcohol Use: No   OB History   Grav Para Term Preterm Abortions TAB SAB Ect Mult Living   3 3 3       3      Review of Systems  Constitutional: Negative for fever.  HENT: Positive  for ear pain and mouth sores. Negative for congestion, rhinorrhea and sore throat.   Musculoskeletal: Negative for neck pain.  Skin: Negative for rash.  All other systems reviewed and are negative.      Allergies  Review of patient's allergies indicates no known allergies.  Home Medications  No current outpatient prescriptions on file. BP 117/76  Pulse 68  Temp(Src) 98.4 F (36.9 C) (Oral)  Resp 14  Ht 5\' 6"  (1.676 m)  Wt 135 lb (61.236 kg)  BMI 21.80 kg/m2  SpO2 100%  LMP 09/19/2013 Physical Exam  Nursing note and vitals reviewed. Constitutional: She is oriented to person, place, and time. She appears well-developed and well-nourished.  HENT:  Head: Normocephalic and atraumatic.  Right Ear: External ear normal.  Left Ear: External ear normal.  Nose: Nose normal.  Mouth/Throat: Uvula is midline. No dental abscesses or dental caries. No oropharyngeal exudate, posterior oropharyngeal edema or posterior oropharyngeal erythema.    Bilateral ear canals normal.  No TMJ tenderness.  RIght anterior dorsal tongue has a 2.5cm area that appears shiny with decreased papillae.  Slightly tender to palpation but not swollen.  Margin is well defined.  No plaques. Remainder of mouth appears normal.  Eyes: Conjunctivae are normal. Pupils are equal,  round, and reactive to light. Right eye exhibits no discharge. Left eye exhibits no discharge.  Neck: Normal range of motion. Neck supple.  Cardiovascular: Normal heart sounds.   Pulmonary/Chest: Breath sounds normal.  Lymphadenopathy:    She has no cervical adenopathy.  Neurological: She is alert and oriented to person, place, and time.  Skin: Skin is warm and dry. No rash noted.    ED Course  Procedures  none    Labs Reviewed  VITAMIN B12  FOLATE  POCT CBC W AUTO DIFF ([facility])  WBC 6.5; LY 26.3; MO 7.0; GR 66.7; Hgb 12.8; Platelets 176          MDM   Final diagnoses:  Atrophic glossitis; ?vitamin deficiency      Recommend daily multi-vitamin.  Obtain a night-time chin strap to prevent mouth breathing at night and consequent drying of mouth mucosae. Check B12 and folate Followup with ENT if not improving.  If sinus congestion prevents nasal breathing at night may need Flonase, etc.    Kandra Nicolas, MD 10/13/13 [phone]  Addendum Will give Rx for Kenalog in [place] to use prn for discomfort.  Kandra Nicolas, MD 10/13/13 [phone]

## 2013-10-11 NOTE — ED Notes (Signed)
Alexis Watts c/o sore on right side of tongue x 6 weeks. He dentist thought it may be an allergy so she switched her toothpaste and foods she was eating without any result. More recently she has developed right ear pain.

## 2013-10-12 LAB — VITAMIN B12: Vitamin B-12: 585 pg/mL (ref 211–911)

## 2013-10-12 LAB — FOLATE: Folate: >20 ng/mL

## 2013-10-15 ENCOUNTER — Telehealth: Payer: Self-pay | Admitting: *Deleted

## 2013-11-12 ENCOUNTER — Telehealth: Payer: Self-pay | Admitting: Family Medicine

## 2013-11-12 ENCOUNTER — Ambulatory Visit
Admission: RE | Admit: 2013-11-12 | Discharge: 2013-11-12 | Disposition: A | Discharge status: Home / Self Care | Payer: BC Managed Care – PPO | Source: Ambulatory Visit | Attending: Family Medicine | Admitting: Family Medicine

## 2013-11-12 ENCOUNTER — Ambulatory Visit (INDEPENDENT_AMBULATORY_CARE_PROVIDER_SITE_OTHER): Payer: BC Managed Care – PPO | Admitting: Family Medicine

## 2013-11-12 VITALS — BP 120/78 | HR 67 | Temp 97.9°F | Resp 18 | Ht 66.5 in | Wt 130.8 lb

## 2013-11-12 DIAGNOSIS — R209 Unspecified disturbances of skin sensation: Secondary | ICD-10-CM

## 2013-11-12 DIAGNOSIS — H9319 Tinnitus, unspecified ear: Secondary | ICD-10-CM

## 2013-11-12 DIAGNOSIS — H539 Unspecified visual disturbance: Secondary | ICD-10-CM

## 2013-11-12 DIAGNOSIS — R208 Other disturbances of skin sensation: Secondary | ICD-10-CM

## 2013-11-12 LAB — COMPREHENSIVE METABOLIC PANEL
ALT: 17 U/L (ref 0–35)
AST: 21 U/L (ref 0–37)
Albumin: 4.4 g/dL (ref 3.5–5.2)
Alkaline Phosphatase: 60 U/L (ref 39–117)
BUN: 15 mg/dL (ref 6–23)
CO2: 28 mEq/L (ref 19–32)
Calcium: 9.9 mg/dL (ref 8.4–10.5)
Chloride: 102 mEq/L (ref 96–112)
Creat: 0.8 mg/dL (ref 0.50–1.10)
Glucose, Bld: 90 mg/dL (ref 70–99)
Potassium: 4 mEq/L (ref 3.5–5.3)
Sodium: 141 mEq/L (ref 135–145)
Total Bilirubin: 0.7 mg/dL (ref 0.2–1.2)
Total Protein: 7.2 g/dL (ref 6.0–8.3)

## 2013-11-12 LAB — POCT SEDIMENTATION RATE: POCT SED RATE: 10 mm/hr (ref 0–22)

## 2013-11-12 MED ORDER — GADOBENATE DIMEGLUMINE 529 MG/ML IV SOLN
12.0000 mL | Freq: Once | INTRAVENOUS | Status: AC | PRN
  Administered 2013-11-12: 12 mL via INTRAVENOUS

## 2013-11-12 NOTE — Progress Notes (Signed)
Subjective:    Patient ID: Alexis Watts, female    DOB: 20-Mar-1971, 43 y.o.   MRN: [id number]  HPI Scribed for Robyn Haber MD, the patient was seen in room 4. This chart was scribed by Denice Bors, ED scribe. Patient's care was started at 8:32 AM  HPI Comments: Hx was provided by the pt.   Alexis Watts is a 43 y.o. female who presents to the [facility] complaining of constant right sided tinnitus onset 1 week while at work. Reports assoiciated right sided otalgia, right eye dryness, and occasional "floater" in right eye. Describes tinnitus as high pitched ringing and otalgia as mild in severity. Denies any aggravating or alleviating factors. Denies associated decreased hearing, headaches, dizziness, and balance changes.   Teaches and tutors at school.   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 Denies taking any medications at this time.  Past Medical History  Diagnosis Date   Bradycardia     Past Surgical History  Procedure Laterality Date   Ganglion cyst excision     Ganglion cyst excision Left     Family History  Problem Relation Age of Onset   Breast cancer Maternal Grandmother     History   Social History   Marital Status: Married    Spouse Name: N/A    Number of Children: N/A   Years of Education: N/A   Occupational History   Not on file.   Social History Main Topics   Smoking status: Never Smoker    Smokeless tobacco: Never Used   Alcohol Use: No   Drug Use: No   Sexual Activity: Yes     Comment: vasectomy   Other Topics Concern   Not on file   Social History Narrative   No narrative on file    No Known Allergies  Patient Active Problem List   Diagnosis Date Noted   Health care maintenance 10/25/2011   Bradycardia, sinus 10/25/2011    Filed Vitals:   11/12/13 0811  BP: 120/78  Pulse: 67  Temp: 97.9 F (36.6 C)  TempSrc: Oral  Resp: 18  Height: 5' 6.5"  (1.689 m)  Weight: 130 lb 12.8 oz (59.33 kg)  SpO2: 100%         Review of Systems  Constitutional: Negative for fever.  HENT: Positive for ear pain and tinnitus.   Eyes: Positive for visual disturbance.  Neurological: Negative for dizziness and headaches.  Psychiatric/Behavioral: Negative for confusion.       Objective:   Physical Exam  Nursing note and vitals reviewed. Constitutional: She is oriented to person, place, and time. She appears well-developed and well-nourished. No distress.  HENT:  Head: Normocephalic and atraumatic.  Right Ear: Tympanic membrane, external ear and ear canal normal.  Left Ear: Tympanic membrane, external ear and ear canal normal.  Eyes: Conjunctivae and EOM are normal. Pupils are equal, round, and reactive to light.  Neck: Neck supple. No tracheal deviation present.  Cardiovascular: Normal rate.   Pulmonary/Chest: Effort normal. No respiratory distress.  Musculoskeletal: Normal range of motion.  Neurological: She is alert and oriented to person, place, and time.  Skin: Skin is warm and dry.  Psychiatric: She has a normal mood and affect. Her behavior is normal.     COORDINATION OF CARE:  Nursing notes reviewed. Vital signs reviewed. Initial pt interview and examination performed.   8:41 AM-Discussed work up plan with pt at bedside, which includes MRI  of head, CMP, and sed rate. Pt agrees with plan.   Treatment plan initiated:Medications - No data to display   Initial diagnostic testing ordered.    Results for orders placed during the hospital encounter of 10/11/13  VITAMIN B12      Result Value Ref Range   Vitamin B-12 585  211 - 911 pg/mL  FOLATE      Result Value Ref Range   Folate >20.0    POCT CBC W AUTO DIFF ([facility])      Result Value Ref Range   WBC    4.5 - 10.5 K/uL   Lymphocytes relative %    15 - 45 %   Monocytes relative %    2 - 10 %   Neutrophils relative % (GR)    44 - 76 %   Lymphocytes absolute     0.1 - 1.8 K/uL   Monocyes absolute    0.1 - 1 K/uL   Neutrophils absolute (GR#)    1.7 - 7.8 K/uL   RBC    3.8 - 5.1 MIL/uL   Hemoglobin    11.8 - 15.5 g/dL   Hematocrit    34.8 - 46 %   MCV    78 - 100 fL   MCH    26 - 32 pg   MCHC    32 - 36.5 g/dL   RDW    11.6 - 14 %   Platelet count    140 - 400 K/uL   MPV    7.8 - 11 fL        Assessment & Plan:   Tinnitus - Plan: POCT SEDIMENTATION RATE, Comprehensive metabolic panel, MR Brain W Wo Contrast, MR Brain Wo Contrast  Visual disturbance - Plan: POCT SEDIMENTATION RATE, Comprehensive metabolic panel, MR Brain W Wo Contrast, MR Brain Wo Contrast  Dysesthesia - Plan: POCT SEDIMENTATION RATE, Comprehensive metabolic panel, MR Brain W Wo Contrast, MR Brain Wo Contrast  Signed, Robyn Haber, MD

## 2013-11-12 NOTE — Telephone Encounter (Signed)
Per Dr. Joseph Art called patient MRI negative (normal) will refer to neurologist. Patient notified voiced understanding but wants to research and call back to let us know which neurologist she would like to see.

## 2014-08-19 DIAGNOSIS — L80 Vitiligo: Secondary | ICD-10-CM

## 2014-08-19 HISTORY — DX: Vitiligo: L80

## 2014-08-28 ENCOUNTER — Ambulatory Visit (INDEPENDENT_AMBULATORY_CARE_PROVIDER_SITE_OTHER): Payer: BC Managed Care – PPO

## 2014-08-28 ENCOUNTER — Ambulatory Visit (INDEPENDENT_AMBULATORY_CARE_PROVIDER_SITE_OTHER): Payer: BC Managed Care – PPO | Admitting: Emergency Medicine

## 2014-08-28 VITALS — BP 122/64 | HR 57 | Temp 97.6°F | Resp 16 | Ht 66.0 in | Wt 132.2 lb

## 2014-08-28 DIAGNOSIS — K921 Melena: Secondary | ICD-10-CM

## 2014-08-28 DIAGNOSIS — M545 Low back pain: Secondary | ICD-10-CM

## 2014-08-28 DIAGNOSIS — K5900 Constipation, unspecified: Secondary | ICD-10-CM

## 2014-08-28 DIAGNOSIS — R350 Frequency of micturition: Secondary | ICD-10-CM

## 2014-08-28 LAB — POCT URINALYSIS DIPSTICK
BILIRUBIN UA: NEGATIVE
GLUCOSE UA: NEGATIVE
Ketones, UA: NEGATIVE
LEUKOCYTES UA: NEGATIVE
NITRITE UA: NEGATIVE
Protein, UA: NEGATIVE
RBC UA: NEGATIVE
Spec Grav, UA: 1.015
UROBILINOGEN UA: 0.2
pH, UA: 6

## 2014-08-28 LAB — POCT UA - MICROSCOPIC ONLY
BACTERIA, U MICROSCOPIC: NEGATIVE
CASTS, UR, LPF, POC: NEGATIVE
CRYSTALS, UR, HPF, POC: NEGATIVE
Mucus, UA: NEGATIVE
RBC, URINE, MICROSCOPIC: NEGATIVE
Yeast, UA: NEGATIVE

## 2014-08-28 NOTE — Patient Instructions (Signed)
You may use Miralax, a laxative, over the counter to help your constipation which may be contributing to your low back pain and urinary symptoms. You can supplement this with docusate, a stool softener. We will also refer you to GI for evaluation, possible colonoscopy to rule out a large bowel obstruction, mass.

## 2014-08-28 NOTE — Progress Notes (Signed)
MRN: [id number] DOB: 10/29/1970  Subjective:   Alexis Watts is a 44 y.o. female presenting for 3 day history nocturia, urgency. Denies dysuria, hematuria, incontinence, fevers, abdominal pain, pelvic pain, n/v, diarrhea, vaginal discharge, vaginal irritation. Has tried to stay hydrated, drinking cranberry juice with no change in symptoms. Also has had low back pain this week, admits a longstanding history of low back pain associated with increased sports activities which has been the case this week as well. Previously has had a full work up, including an MRI and inflammatory markers that were all normal. Back pain occurs only at night, does not radiate, does not limit activity, no decreased ROM, no numbness or tingling, problems with defecation. Back pain is sometimes relieved with ibuprofen. Of note, patient also admits that Alexis Watts had a small amount of blood in her stool x1 a couple of weeks ago. Has regular bowel movements ~1/day, eats plenty of fiber, stools are soft, no straining. Patient has had this once before, denies family history of colon cancer, had a colonoscopy done (2014) that was completely normal, no polyps, was thought to be related to possible endometriosis. No smoking or alcohol. Denies any other aggravating or relieving factors, no other questions or concerns.  Alexis Watts currently has no medications in their medication list.  Alexis Watts has No Known Allergies.  Alexis Watts  has a past medical history of Bradycardia. Also  has past surgical history that includes Ganglion cyst excision and Ganglion cyst excision (Left).  ROS As in subjective.  Objective:   Vitals: BP 122/64 mmHg  Pulse 57  Temp(Src) 97.6 F (36.4 C) (Oral)  Resp 16  Ht 5\' 6"  (1.676 m)  Wt 132 lb 3.2 oz (59.966 kg)  BMI 21.35 kg/m2  SpO2 100%  LMP 08/12/2014  Physical Exam  Constitutional: Alexis Watts is oriented to person, place, and time and well-developed, well-nourished, and in no distress.  Cardiovascular: Normal  rate, regular rhythm, normal heart sounds and intact distal pulses.  Exam reveals no gallop and no friction rub.   No murmur heard. Pulmonary/Chest: Effort normal and breath sounds normal. No respiratory distress. Alexis Watts has no wheezes. Alexis Watts has no rales. Alexis Watts exhibits no tenderness.  Abdominal: Soft. Bowel sounds are normal. Alexis Watts exhibits no distension and no mass. There is no tenderness.  Musculoskeletal: Normal range of motion. Alexis Watts exhibits no edema or tenderness.  Neurological: Alexis Watts is alert and oriented to person, place, and time. Alexis Watts has normal reflexes.  Skin: Skin is warm and dry. No rash noted. Alexis Watts is not diaphoretic. No erythema.  Psychiatric: Mood and affect normal.   Results for orders placed or performed in visit on 08/28/14 (from the past 24 hour(s))  POCT urinalysis dipstick     Status: None   Collection Time: 08/28/14  8:24 AM  Result Value Ref Range   Color, UA yellow    Clarity, UA clear    Glucose, UA neg    Bilirubin, UA neg    Ketones, UA neg    Spec Grav, UA 1.015    Blood, UA neg    pH, UA 6.0    Protein, UA neg    Urobilinogen, UA 0.2    Nitrite, UA neg    Leukocytes, UA Negative   POCT UA - Microscopic Only     Status: None   Collection Time: 08/28/14  8:24 AM  Result Value Ref Range   WBC, Ur, HPF, POC 0-2    RBC, urine, microscopic neg    Bacteria, U  Microscopic neg    Mucus, UA neg    Epithelial cells, urine per micros 1-4    Crystals, Ur, HPF, POC neg    Casts, Ur, LPF, POC neg    Yeast, UA neg    [facility] reading (PRIMARY) by  Dr. Ouida Sills and PA-Delray Reza. Lumbar spine: Large stool burden, normal bowel gas pattern otherwise. Excellent joint space, no disc slippage or lytic lesions.   Dg Lumbar Spine Complete  08/28/2014   CLINICAL DATA:  Three-day history of nocturia and urgency. Longstanding history of low back pain.  EXAM: LUMBAR SPINE - COMPLETE 4+ VIEW  COMPARISON:  None.  FINDINGS: There are 5 non rib-bearing lumbar type vertebral bodies.  Normal alignment  of the lumbar spine. No anterolisthesis or retrolisthesis. No pars defects.  Lumbar vertebral body heights and intervertebral disc spaces are preserved.  Limited visualization the bilateral SI joints is normal.  Regional bowel gas pattern and soft tissues are normal.  IMPRESSION: Unremarkable lumbar spine radiographic series   Electronically Signed   By: Sandi Mariscal M.D.   On: 08/28/2014 10:06   Assessment and Plan :   1. Urinary frequency 2. Low back pain without sciatica, unspecified back pain laterality 3. Blood in stool 4. Constipation, unspecified constipation type - UA reassuring, urine culture pending. Lumbar spine x-ray and previous work-up reassuring to r/o structural source of symptoms. - Low back pain, constipation and bloody stool may be related, will send to GI to r/o large bowel obstruction despite healthy lifestyle, minimal risk factors for colon cancer. - Otherwise will try Miralax with or without docusate to help with stool burden.  Jaynee Eagles, PA-C [facility] [phone] 08/28/2014 8:31 AM

## 2014-08-29 LAB — URINE CULTURE
Colony Count: NO GROWTH
ORGANISM ID, BACTERIA: NO GROWTH

## 2014-08-31 ENCOUNTER — Encounter: Payer: Self-pay | Admitting: Urgent Care

## 2015-07-17 ENCOUNTER — Encounter: Payer: Self-pay | Admitting: Internal Medicine

## 2015-10-10 ENCOUNTER — Other Ambulatory Visit: Payer: Self-pay | Admitting: *Deleted

## 2015-10-10 ENCOUNTER — Encounter: Payer: Self-pay | Admitting: Vascular Surgery

## 2015-10-10 DIAGNOSIS — I83813 Varicose veins of bilateral lower extremities with pain: Secondary | ICD-10-CM

## 2015-11-08 ENCOUNTER — Encounter: Payer: Self-pay | Admitting: Obstetrics and Gynecology

## 2015-11-08 ENCOUNTER — Ambulatory Visit (INDEPENDENT_AMBULATORY_CARE_PROVIDER_SITE_OTHER): Payer: BC Managed Care – PPO | Admitting: Obstetrics and Gynecology

## 2015-11-08 VITALS — BP 104/70 | HR 60 | Resp 14 | Ht 66.0 in | Wt 132.0 lb

## 2015-11-08 DIAGNOSIS — Z1231 Encounter for screening mammogram for malignant neoplasm of breast: Secondary | ICD-10-CM

## 2015-11-08 DIAGNOSIS — Z0000 Encounter for general adult medical examination without abnormal findings: Secondary | ICD-10-CM

## 2015-11-08 DIAGNOSIS — Z01419 Encounter for gynecological examination (general) (routine) without abnormal findings: Secondary | ICD-10-CM

## 2015-11-08 LAB — CBC
HEMATOCRIT: 39.4 % (ref 36.0–46.0)
Hemoglobin: 12.8 g/dL (ref 12.0–15.0)
MCH: 27.4 pg (ref 26.0–34.0)
MCHC: 32.5 g/dL (ref 30.0–36.0)
MCV: 84.2 fL (ref 78.0–100.0)
MPV: 9.9 fL (ref 8.6–12.4)
Platelets: 202 10*3/uL (ref 150–400)
RBC: 4.68 MIL/uL (ref 3.87–5.11)
RDW: 13.9 % (ref 11.5–15.5)
WBC: 5.3 10*3/uL (ref 4.0–10.5)

## 2015-11-08 LAB — LIPID PANEL
CHOL/HDL RATIO: 2.6 ratio (ref <=5.0)
CHOLESTEROL: 158 mg/dL (ref 125–200)
HDL: 61 mg/dL (ref >=46)
LDL Cholesterol: 82 mg/dL (ref <130)
Triglycerides: 73 mg/dL (ref <150)
VLDL: 15 mg/dL (ref <30)

## 2015-11-08 LAB — POCT URINALYSIS DIPSTICK
BILIRUBIN UA: NEGATIVE
Glucose, UA: NEGATIVE
KETONES UA: NEGATIVE
Leukocytes, UA: NEGATIVE
NITRITE UA: NEGATIVE
PH UA: 5
Protein, UA: NEGATIVE
RBC UA: NEGATIVE
Urobilinogen, UA: NEGATIVE

## 2015-11-08 LAB — COMPREHENSIVE METABOLIC PANEL
ALK PHOS: 54 U/L (ref 33–115)
ALT: 13 U/L (ref 6–29)
AST: 17 U/L (ref 10–35)
Albumin: 4.5 g/dL (ref 3.6–5.1)
BILIRUBIN TOTAL: 0.6 mg/dL (ref 0.2–1.2)
BUN: 12 mg/dL (ref 7–25)
CALCIUM: 9.3 mg/dL (ref 8.6–10.2)
CO2: 26 mmol/L (ref 20–31)
Chloride: 104 mmol/L (ref 98–110)
Creat: 0.75 mg/dL (ref 0.50–1.10)
GLUCOSE: 88 mg/dL (ref 65–99)
Potassium: 3.9 mmol/L (ref 3.5–5.3)
Sodium: 139 mmol/L (ref 135–146)
Total Protein: 7.2 g/dL (ref 6.1–8.1)

## 2015-11-08 NOTE — Progress Notes (Signed)
Patient ID: Alexis Watts, female   DOB: 06-29-1971, 45 y.o.   MRN: [id number] 45 y.o. [id number] Married Caucasian female here for annual exam.    Having a lot of pelvic pressure with first day of menstrual cycle. Feels that her veins of her right leg are painful during her cycle.  Wears compression hose. Is seeing a vein specialist, Dr. Donnetta Hutching.  First day is heavy flow and wears superplus tampon and pad.  Does pad change every 1 - 2 hours. Not actively pursuing eval or tx for this.  Hx of 1 cm anterior subserosal fibroid by ultrasound in 2014.   Urine Dip:  Negative  PCP:  Dr Harlene Ramus   Patient's last menstrual period was 10/17/2015.          Sexually active: Yes.    The current method of family planning is vasectomy.    Exercising: Yes.    yoga, walking and running  Smoker:  no  Health Maintenance: Pap:  11-27-12 WNL NEG HR HPV History of abnormal Pap:  no MMG:  05-08-10 WNL BI RADS 1.  Had a chest wall hematoma following this mammogram.  [facility]. Colonoscopy:  2005 WNL will repeat at age 14 BMD:   Never TDaP:  Up to date with PCP Screening Labs: PCP does labs.  Urine dip - negative.   reports that she has never smoked. She has never used smokeless tobacco. She reports that she drinks alcohol. She reports that she does not use illicit drugs.  Past Medical History  Diagnosis Date  . Bradycardia   . Varicose veins   . Vitiligo 2016    Past Surgical History  Procedure Laterality Date  . Ganglion cyst excision    . Ganglion cyst excision Left   . Wisdom tooth extraction      Current Outpatient Prescriptions  Medication Sig Dispense Refill  . Multiple Vitamin (MULTIVITAMIN) tablet Take 1 tablet by mouth daily.     No current facility-administered medications for this visit.    Family History  Problem Relation Age of Onset  . Breast cancer Maternal Grandmother     ROS:  Pertinent items are noted in HPI.  Otherwise, a comprehensive  ROS was negative.  Exam:   BP 104/70 mmHg  Pulse 60  Resp 14  Ht 5\' 6"  (1.676 m)  Wt 132 lb (59.875 kg)  BMI 21.32 kg/m2  LMP 10/17/2015    General appearance: alert, cooperative and appears stated age Head: Normocephalic, without obvious abnormality, atraumatic Neck: no adenopathy, supple, symmetrical, trachea midline and thyroid normal to inspection and palpation Lungs: clear to auscultation bilaterally Breasts: normal appearance, no masses or tenderness, Inspection negative, No nipple retraction or dimpling, No nipple discharge or bleeding, No axillary or supraclavicular adenopathy Heart: regular rate and rhythm Abdomen: soft, non-tender; bowel sounds normal; no masses,  no organomegaly Extremities: extremities normal, atraumatic, no cyanosis or edema Skin: Skin color, texture, turgor normal. No rashes or lesions Lymph nodes: Cervical, supraclavicular, and axillary nodes normal. No abnormal inguinal nodes palpated Neurologic: Grossly normal  Pelvic: External genitalia:  no lesions              Urethra:  normal appearing urethra with no masses, tenderness or lesions              Bartholins and Skenes: normal                 Vagina: normal appearing vagina with normal color and  discharge, no lesions              Cervix: no lesions              Pap taken: Yes.   Bimanual Exam:  Uterus:  enlarged, 7 - 8  weeks size.  Irregular shape.                Adnexa: normal adnexa and no mass, fullness, tenderness              Rectovaginal: Yes.  .  Confirms.              Anus:  normal sphincter tone, no lesions  Chaperone was present for exam.  Assessment:   Well woman visit with normal exam. Uterine fibroids.   Varicose veins right lower extremity. Hx traumatic mammogram.  Plan: Yearly mammogram recommended after age 51.  We will schedule for the patient at [facility].  Patient agrees to this. Recommended self breast exam.  Pap and HR HPV as above. Discussed Calcium, Vitamin D,  regular exercise program including cardiovascular and weight bearing exercise. Labs performed.  Yes.  .   See orders.  Routine labs. Refills given on medications.  No..   Follow up annually and prn.   Additional counseling given regarding fibroids.  Discussed pelvic ultrasound if patient wishes to pursue tx options.  I did mention progesterone choices, including a Mirena IUD, since she has varicose vein issues.  Mirena IUD handout to patient.   After visit summary provided.

## 2015-11-08 NOTE — Progress Notes (Signed)
Scheduled patient while in office for 3D bilateral screening mamm[facility] with [facility] on 11/29/2015 at 8 am. She is agreeable to date and time.

## 2015-11-08 NOTE — Patient Instructions (Signed)

## 2015-11-10 LAB — IPS PAP TEST WITH HPV

## 2015-11-13 ENCOUNTER — Telehealth: Payer: Self-pay | Admitting: Emergency Medicine

## 2015-11-13 NOTE — Telephone Encounter (Signed)
Patient returned call

## 2015-11-13 NOTE — Telephone Encounter (Signed)
-----   Message from Nunzio Cobbs, MD sent at 11/12/2015 10:15 PM EDT ----- Pap and HR HPV negative.  Recall - 02.

## 2015-11-13 NOTE — Telephone Encounter (Signed)
Message left to return call to [place] at [phone].   02 recall in place. Patient does not have annual exam scheduled.

## 2015-11-13 NOTE — Telephone Encounter (Signed)
Spoke with patient. Advised of results as seen below from [facility]. She is agreeable and verbalizes understanding. 02 recall already placed.  Routing to provider for final review. Patient agreeable to disposition. Will close encounter.

## 2015-11-13 NOTE — Telephone Encounter (Signed)
-----   Message from Nunzio Cobbs, MD sent at 11/10/2015  2:48 PM EDT ----- Please report normal cholesterol, CBC, and CMP to patient.  Pap is pending.  Cc- Marisa Sprinkles

## 2015-11-17 ENCOUNTER — Encounter: Payer: Self-pay | Admitting: Vascular Surgery

## 2015-11-24 ENCOUNTER — Ambulatory Visit (HOSPITAL_COMMUNITY)
Admission: RE | Admit: 2015-11-24 | Discharge: 2015-11-24 | Disposition: A | Discharge status: Home / Self Care | Payer: BC Managed Care – PPO | Source: Ambulatory Visit | Attending: Vascular Surgery | Admitting: Vascular Surgery

## 2015-11-24 ENCOUNTER — Ambulatory Visit (INDEPENDENT_AMBULATORY_CARE_PROVIDER_SITE_OTHER): Payer: BC Managed Care – PPO | Admitting: Vascular Surgery

## 2015-11-24 ENCOUNTER — Encounter: Payer: Self-pay | Admitting: Vascular Surgery

## 2015-11-24 VITALS — BP 114/76 | HR 60 | Ht 66.0 in | Wt 132.3 lb

## 2015-11-24 DIAGNOSIS — I83891 Varicose veins of right lower extremities with other complications: Secondary | ICD-10-CM

## 2015-11-24 DIAGNOSIS — I83813 Varicose veins of bilateral lower extremities with pain: Secondary | ICD-10-CM | POA: Diagnosis present

## 2015-11-24 NOTE — Progress Notes (Signed)
Referring Physician: Leandrew Koyanagi, MD  Patient name: Alexis Watts MRN: [id number] DOB: [birth year] Sex: female  REASON FOR CONSULT: Symptomatic varicose veins  HPI: Alexis Watts is a 45 y.o. female complaining of pain burning heaviness fullness and aching from a right posterior thigh varicose vein. The patient developed varicose veins after delivering her first child in these got worse after her third child. Slowly over the last several years the vein has become more prominent and more symptomatic with the above symptoms. She has a family history of varicose veins in her father. She has worn thigh high compression stockings for several years upon the recommendation of her brother who is a cardiologist. This gives her some relief but does not completely alleviate all symptoms. She also states the symptoms have become progressively worse over the years despite this. She states also the varicose veins are more painful during certain segments of her menstrual cycle. She does have some scattered varicosities in her left leg but she states that these did not really bother her. He denies any prior DVT. She really has no other significant medical problems. 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 All 3 of these activities tend to exacerbate her varicose vein symptoms.   Past Medical History  Diagnosis Date  . Bradycardia   . Varicose veins   . Vitiligo 2016   Past Surgical History  Procedure Laterality Date  . Ganglion cyst excision    . Ganglion cyst excision Left   . Wisdom tooth extraction      Family History  Problem Relation Age of Onset  . Breast cancer Maternal Grandmother     SOCIAL HISTORY: Social History   Social History  . Marital Status: Married    Spouse Name: N/A  . Number of Children: N/A  . Years of Education: N/A   Occupational History  . Not on file.   Social History Main Topics  . Smoking  status: Never Smoker   . Smokeless tobacco: Never Used  . Alcohol Use: 0.0 oz/week    0 Standard drinks or equivalent per week     Comment: 1-2 drinks per week  . Drug Use: No  . Sexual Activity:    Partners: Male     Comment: vasectomy   Other Topics Concern  . Not on file   Social History Narrative    No Known Allergies  Current Outpatient Prescriptions  Medication Sig Dispense Refill  . Multiple Vitamin (MULTIVITAMIN) tablet Take 1 tablet by mouth daily.     No current facility-administered medications for this visit.    ROS:   General:  No weight loss, Fever, chills  HEENT: No recent headaches, no nasal bleeding, no visual changes, no sore throat  Neurologic: No dizziness, blackouts, seizures. No recent symptoms of stroke or mini- stroke. No recent episodes of slurred speech, or temporary blindness.  Cardiac: No recent episodes of chest pain/pressure, no shortness of breath at rest.  No shortness of breath with exertion.  Denies history of atrial fibrillation or irregular heartbeat  Vascular: No history of rest pain in feet.  No history of claudication.  No history of non-healing ulcer, No history of DVT   Pulmonary: No home oxygen, no productive cough, no hemoptysis,  No asthma or wheezing  Musculoskeletal:  [ ]  Arthritis, [ ]  Low back pain,  [ ]  Joint pain  Hematologic:No history of hypercoagulable state.  No  history of easy bleeding.  No history of anemia  Gastrointestinal: No hematochezia or melena,  No gastroesophageal reflux, no trouble swallowing  Urinary: [ ]  chronic Kidney disease, [ ]  on HD - [ ]  MWF or [ ]  TTHS, [ ]  Burning with urination, [ ]  Frequent urination, [ ]  Difficulty urinating;   Skin: No rashes  Psychological: No history of anxiety,  No history of depression   Physical Examination  Filed Vitals:   11/24/15 0843  BP: 114/76  Pulse: 60  Height: 5\' 6"  (1.676 m)  Weight: 132 lb 4.8 oz (60.011 kg)  SpO2: 100%    Body mass index is  21.36 kg/(m^2).  General:  Alert and oriented, no acute distress HEENT: Normal Neck: No bruit or JVD Pulmonary: Clear to auscultation bilaterally Cardiac: Regular Rate and Rhythm without murmur Abdomen: Soft, non-tender, non-distended, no mass, no scars Skin: No rash, 4-5 mm very tortuous varicosity over the entire course of her right posterior lateral thigh, few scattered reticular type veins on her left posterior calf and spider-type blushes on her right medial knee Extremity Pulses:  2+ radial, brachial, femoral, posterior tibial pulses bilaterally Musculoskeletal: No deformity or edema  Neurologic: Upper and lower extremity motor 5/5 and symmetric  DATA:  Patient had a venous reflux exam performed today. I reviewed and interpreted those findings. She did have deep vein reflux in the right popliteal vein. She did not have evidence of reflux in the superficial system of the greater saphenous vein. A large posterior thigh varicosity was noted with no connection to the saphenofemoral junction or greater saphenous vein.  ASSESSMENT:  Patient with symptomatic varicose veins right leg probably not a candidate for laser ablation due to the tortuosity of this posterior vein. She will continue to wear her thigh-high compression stockings. We will consider her for possible stab phlebectomy or stripping of this vein for symptomatically relief.   PLAN:  The patient will follow-up with Dr. earlier Kellie Simmering in 3 months for consideration of this procedure.   Ruta Hinds, MD [facility] Office: [phone] Pager: [phone]

## 2015-11-29 ENCOUNTER — Ambulatory Visit
Admission: RE | Admit: 2015-11-29 | Discharge: 2015-11-29 | Disposition: A | Discharge status: Home / Self Care | Payer: BC Managed Care – PPO | Source: Ambulatory Visit | Attending: Obstetrics and Gynecology | Admitting: Obstetrics and Gynecology

## 2015-11-29 DIAGNOSIS — Z1231 Encounter for screening mammogram for malignant neoplasm of breast: Secondary | ICD-10-CM

## 2016-02-27 ENCOUNTER — Ambulatory Visit: Payer: BC Managed Care – PPO | Admitting: Vascular Surgery

## 2016-03-11 ENCOUNTER — Telehealth: Payer: Self-pay | Admitting: Obstetrics and Gynecology

## 2016-03-11 DIAGNOSIS — N920 Excessive and frequent menstruation with regular cycle: Secondary | ICD-10-CM

## 2016-03-11 NOTE — Telephone Encounter (Signed)
Please schedule pelvic ultrasound with me.  If patient is still having heavy bleeding with frequency pad changes, I would like to do sonohysterogram also. If any irregular vaginal bleeding, endometrial biopsy may be indicated as well.

## 2016-03-11 NOTE — Telephone Encounter (Signed)
Patient returned call. Advised of recommendation from Dr Quincy Simmonds. Brief description of procedures discussed. Patient states heavy bleeding is only with her cycles.  Patient's husband with vasectomy and also uses condoms. Appointment scheduled for 03-21-16. LMP 02-21-16, patient will call with menses to determine if needs to reschedule appointment. Patient is anxious to proceed with appointment. Will call if any cancellations this week.

## 2016-03-11 NOTE — Telephone Encounter (Signed)
Patient called and said, "I have been having frequent urination and back pain. Dr. Quincy Simmonds told me to call if I ever felt pain or had any problems with a large fibroid she diagnosed me with." Patient stated she does not think it is a urinary tract infection.  Patient last seen 11/08/15.

## 2016-03-11 NOTE — Telephone Encounter (Signed)
Message left to return call to Reesha Debes at [phone].    

## 2016-03-11 NOTE — Telephone Encounter (Signed)
Return call to patient. She states when she last had exam Dr. Quincy Simmonds noticed an enlarged uterus. This has not caused her any problems until over the last few months. She is noticing increased urination, denies dysuria and intermittment lower back discomfort. Especially after a long car ride recently.   Denies any acute complaints at this time.   Advised will send message to Dr. Quincy Simmonds.  Possibly plan ultrasound and consult with Dr. Quincy Simmonds.  Patient agreeable.  Advised will call back with message from Dr. Quincy Simmonds.

## 2016-03-12 NOTE — Telephone Encounter (Signed)
Order placed for Sonohysterogram With Dr. Quincy Simmonds.  Routing to provider for final review. Patient agreeable to disposition. Will close encounter.    cc Lerry Liner for insurance pre-certification and patient contact.

## 2016-03-14 ENCOUNTER — Other Ambulatory Visit: Payer: BC Managed Care – PPO

## 2016-03-19 ENCOUNTER — Telehealth: Payer: Self-pay | Admitting: Obstetrics and Gynecology

## 2016-03-19 NOTE — Telephone Encounter (Signed)
Patient called and said, "I have an ultrasound on 03/21/16 that I may need to cancel. They told me to call if I had any bleeding prior to the appointment and I am having bleeding."  Appointment not cancelled until a nurse assesses patient first.  Last seen 11/08/15

## 2016-03-19 NOTE — Telephone Encounter (Signed)
Spoke with patient. Patient is scheduled for a SHGM and possible EMB on 03/21/2016 with Dr.Silva. Reports she started her menses today. States the first 2-3 days are usually heavy and was advised to contact the office to reschedule if she was going to be on her menses due to possible EMB. Appointment rescheduled to 03/28/2016 at 8:30 am with 9:00 am consult with Dr.Silva. She is agreeable to date and time.  Routing to provider for final review. Patient agreeable to disposition. Will close encounter.

## 2016-03-21 ENCOUNTER — Other Ambulatory Visit: Payer: BC Managed Care – PPO | Admitting: Obstetrics and Gynecology

## 2016-03-21 ENCOUNTER — Other Ambulatory Visit: Payer: BC Managed Care – PPO

## 2016-03-26 ENCOUNTER — Ambulatory Visit: Payer: BC Managed Care – PPO | Admitting: Vascular Surgery

## 2016-03-28 ENCOUNTER — Ambulatory Visit (INDEPENDENT_AMBULATORY_CARE_PROVIDER_SITE_OTHER): Payer: BC Managed Care – PPO

## 2016-03-28 ENCOUNTER — Ambulatory Visit (INDEPENDENT_AMBULATORY_CARE_PROVIDER_SITE_OTHER): Payer: BC Managed Care – PPO | Admitting: Obstetrics and Gynecology

## 2016-03-28 ENCOUNTER — Encounter: Payer: Self-pay | Admitting: Obstetrics and Gynecology

## 2016-03-28 ENCOUNTER — Other Ambulatory Visit: Payer: Self-pay | Admitting: Obstetrics and Gynecology

## 2016-03-28 VITALS — BP 110/66 | HR 56 | Ht 66.0 in | Wt 129.0 lb

## 2016-03-28 DIAGNOSIS — N920 Excessive and frequent menstruation with regular cycle: Secondary | ICD-10-CM

## 2016-03-28 LAB — CBC
HEMATOCRIT: 40.4 % (ref 35.0–45.0)
Hemoglobin: 12.7 g/dL (ref 11.7–15.5)
MCH: 27.1 pg (ref 27.0–33.0)
MCHC: 31.4 g/dL — AB (ref 32.0–36.0)
MCV: 86.1 fL (ref 80.0–100.0)
MPV: 9.9 fL (ref 7.5–12.5)
Platelets: 193 10*3/uL (ref 140–400)
RBC: 4.69 MIL/uL (ref 3.80–5.10)
RDW: 13.7 % (ref 11.0–15.0)
WBC: 5 10*3/uL (ref 3.8–10.8)

## 2016-03-28 NOTE — Patient Instructions (Addendum)
Endometrial Biopsy, Care After Refer to this sheet in the next few weeks. These instructions provide you with information on caring for yourself after your procedure. Your health care provider may also give you more specific instructions. Your treatment has been planned according to current medical practices, but problems sometimes occur. Call your health care provider if you have any problems or questions after your procedure. WHAT TO EXPECT AFTER THE PROCEDURE After your procedure, it is typical to have the following:  You may have mild cramping and a small amount of vaginal bleeding for a few days after the procedure. This is normal. HOME CARE INSTRUCTIONS  Only take over-the-counter or prescription medicine as directed by your health care provider.  Do not douche, use tampons, or have sexual intercourse until your health care provider approves.  Follow your health care provider's instructions regarding any activity restrictions, such as strenuous exercise or heavy lifting. SEEK MEDICAL CARE IF:  You have heavy bleeding or bleeding longer than 2 days after the procedure.  You have bad smelling drainage from your vagina.  You have a fever and chills.  Youhave severe lower stomach (abdominal) pain. SEEK IMMEDIATE MEDICAL CARE IF:  You have severe cramps in your stomach or back.  You pass large blood clots.  Your bleeding increases.  You become weak or lightheaded, or you pass out.   This information is not intended to replace advice given to you by your health care provider. Make sure you discuss any questions you have with your health care provider.   Document Released: 05/26/2013 Document Reviewed: 05/26/2013 Elsevier Interactive Patient Education 2016 Elsevier Inc.  Tranexamic acid oral tablets What is this medicine? TRANEXAMIC ACID (TRAN ex AM ik AS id) slows down or stops blood clots from being broken down. This medicine is used to treat heavy monthly menstrual  bleeding. This medicine may be used for other purposes; ask your health care provider or pharmacist if you have questions. What should I tell my health care provider before I take this medicine? They need to know if you have any of these conditions: -bleeding in the brain -blood clotting problems -kidney disease -vision problems -an unusual allergic reaction to tranexamic acid, other medicines, foods, dyes, or preservatives -pregnant or trying to get pregnant -breast-feeding How should I use this medicine? Take this medicine by mouth with a glass of water. Follow the directions on the prescription label. Do not cut, crush, or chew this medicine. You can take it with or without food. If it upsets your stomach, take it with food. Take your medicine at regular intervals. Do not take it more often than directed. Do not stop taking except on your doctor's advice. Do not take this medicine until your period has started. Do not take it for more than 5 days in a row. Do not take this medicine when you do not have your period. Talk to your pediatrician regarding the use of this medicine in children. While this drug may be prescribed for female children as young as 32 years of age for selected conditions, precautions do apply. Overdosage: If you think you have taken too much of this medicine contact a poison control center or emergency room at once. NOTE: This medicine is only for you. Do not share this medicine with others. What if I miss a dose? If you miss a dose, take it when you remember, and then take your next dose at least 6 hours later. Do not take more than 2 tablets at  a time to make up for missed doses. What may interact with this medicine? Do not take this medicine with any of the following medications: -estrogens -birth control pills, patches, injections, rings or other devices that contain both an estrogen and a progestin This medicine may also interact with the following  medications: -certain medicines used to help your blood clot -tretinoin (taken by mouth) This list may not describe all possible interactions. Give your health care provider a list of all the medicines, herbs, non-prescription drugs, or dietary supplements you use. Also tell them if you smoke, drink alcohol, or use illegal drugs. Some items may interact with your medicine. What should I watch for while using this medicine? Tell your doctor or healthcare professional if your symptoms do not start to get better or if they get worse. Tell your doctor or healthcare professional if you notice any eye problems while taking this medicine. Your doctor will refer you to an eye doctor who will examine your eyes. What side effects may I notice from receiving this medicine? Side effects that you should report to your doctor or health care professional as soon as possible: -allergic reactions like skin rash, itching or hives, swelling of the face, lips, or tongue -breathing difficulties -changes in vision -sudden or severe pain in the chest, legs, head, or groin -unusually weak or tired Side effects that usually do not require medical attention (Report these to your doctor or health care professional if they continue or are bothersome.): -back pain -headache -muscle or joint aches -sinus and nasal problems -stomach pain -tiredness This list may not describe all possible side effects. Call your doctor for medical advice about side effects. You may report side effects to FDA at 1-800-FDA-1088. Where should I keep my medicine? Keep out of the reach of children. Store at room temperature between 15 and 30 degrees C (59 and 86 degrees F). Throw away any unused medicine after the expiration date. NOTE: This sheet is a summary. It may not cover all possible information. If you have questions about this medicine, talk to your doctor, pharmacist, or health care provider.    2016, Elsevier/Gold Standard.  (2014-01-27 14:14:28)

## 2016-03-28 NOTE — Progress Notes (Signed)
Patient ID: Alexis Watts, female   DOB: 09/03/1970, 45 y.o.   MRN: [id number] GYNECOLOGY  VISIT   HPI: 45 y.o.   Married  Caucasian  female   [phone] with Patient's last menstrual period was 03/19/2016 (exact date).   here for pelvic ultrasound for possible enlarged uterus,heavy cycles and frequent urination.    Menses are heavy with first 2 days using superplus tampon and pad and changing pad every 1 - 1.5 hours. Not very interested in an IUD at this time.   Has varicose veins.   States mother had endometriosis.   GYNECOLOGIC HISTORY: Patient's last menstrual period was 03/19/2016 (exact date). Contraception:  Vasectomy Menopausal hormone therapy:  n/a Last mammogram:  11-29-15 Density D/Neg/BiRads1:[facility] Last pap smear:   11-08-15 Neg:Neg HR HPV        OB History    Gravida Para Term Preterm AB Living   3 3 3     3    SAB TAB Ectopic Multiple Live Births                     Patient Active Problem List   Diagnosis Date Noted  . Health care maintenance 10/25/2011  . Bradycardia, sinus 10/25/2011    Past Medical History:  Diagnosis Date  . Bradycardia   . Varicose veins   . Vitiligo 2016    Past Surgical History:  Procedure Laterality Date  . GANGLION CYST EXCISION    . GANGLION CYST EXCISION Left   . WISDOM TOOTH EXTRACTION      Current Outpatient Prescriptions  Medication Sig Dispense Refill  . Multiple Vitamin (MULTIVITAMIN) tablet Take 1 tablet by mouth daily.     No current facility-administered medications for this visit.      ALLERGIES: Review of patient's allergies indicates no known allergies.  Family History  Problem Relation Age of Onset  . Breast cancer Maternal Grandmother     Social History   Social History  . Marital status: Married    Spouse name: N/A  . Number of children: N/A  . Years of education: N/A   Occupational History  . Not on file.   Social History Main Topics  . Smoking status: Never Smoker  . Smokeless  tobacco: Never Used  . Alcohol use 0.0 oz/week     Comment: 1-2 drinks per week  . Drug use: No  . Sexual activity: Yes    Partners: Male     Comment: vasectomy   Other Topics Concern  . Not on file   Social History Narrative  . No narrative on file    ROS:  Pertinent items are noted in HPI.  PHYSICAL EXAMINATION:    BP 110/66 (BP Location: Right Arm, Patient Position: Sitting, Cuff Size: Normal)   Pulse (!) 56   Ht 5\' 6"  (1.676 m)   Wt 129 lb (58.5 kg)   LMP 03/19/2016 (Exact Date)   BMI 20.82 kg/m     General appearance: alert, cooperative and appears stated age   Technique:  Both transabdominal and transvaginal ultrasound examinations of the pelvis were performed. Transabdominal technique was performed for global imaging of the pelvis including uterus, ovaries, adnexal regions, and pelvic cul-de-sac. It was necessary to proceed with endovaginal exam following the abdominal ultrasound.  Transabdominal exam to visualize the endometrium and adnexa.  Color and duplex Doppler ultrasound was utilized to evaluate blood flow to the ovaries.   Uterus with 1.4 cm subserosal fibroid.  EMS 9/07 mm. Normal  ovaries.  No free fluid.  Procedure - sonohysterogram Consent performed. Speculum placed in vagina. Sterile prep of cervix with Hibiclens. Cannula placed inside endometrial cavity without difficulty. Speculum removed. Sterile saline injected.      No        filling defect noted. Cannula removed. No complication.   Procedure - endometrial biopsy Consent performed. Speculum place in vagina.  Sterile prep of cervix with Hibiclens Tenaculum to anterior cervical lip. Pipelle placed to     7     cm without difficulty twice. Tissue obtained and sent to pathology. Speculum removed.  No complications. Minimal EBL.   Chaperone was present for exam.  ASSESSMENT  Menorrhagia with regular cycles.   PLAN  Discussion of fibroid and menorrhagia. Follow up EMB.  CBC today.   Discussed tx options Lysteda, progesterone contraceptives, ablation, hysterectomy.  We had a focused discussion of Lysteda and Mirena IUD and discussed risks and benefits.  Reading material given.  Patient will consider her options and consult two family members who are physicians.  Follow up prn.    An After Visit Summary was printed and given to the patient.  __25____ minutes face to face time of which over 50% was spent in counseling.

## 2016-04-01 ENCOUNTER — Telehealth: Payer: Self-pay | Admitting: Obstetrics and Gynecology

## 2016-04-01 LAB — IPS OTHER TISSUE BIOPSY

## 2016-04-01 NOTE — Telephone Encounter (Signed)
Patient returning my call.  EMB benign.  She is doing research about Mirena and will let me know her choice.

## 2016-04-01 NOTE — Telephone Encounter (Signed)
Phone call to discuss test results.  NA on home phone.  LM for her to return call.  No details left.   EMB results benign.  No hyperplasia, atypia, cancer or polyp.  Patient was considering Mirena IUD as an option for treatment of her bleeding.

## 2016-04-08 ENCOUNTER — Encounter: Payer: Self-pay | Admitting: Vascular Surgery

## 2016-04-09 ENCOUNTER — Ambulatory Visit: Payer: BC Managed Care – PPO | Admitting: Vascular Surgery

## 2016-04-16 ENCOUNTER — Encounter: Payer: Self-pay | Admitting: Vascular Surgery

## 2016-04-23 ENCOUNTER — Ambulatory Visit: Payer: BC Managed Care – PPO | Admitting: Vascular Surgery

## 2016-05-14 ENCOUNTER — Telehealth: Payer: Self-pay | Admitting: Obstetrics and Gynecology

## 2016-05-14 NOTE — Telephone Encounter (Signed)
Pt called and has a very painful lump in her left breast. onset approx 2 days ago.  Last seen 03/28/2016

## 2016-05-14 NOTE — Telephone Encounter (Signed)
Spoke with patient. Reports she woke up 2 days ago with pain in her left breast. States she has a lump right behind her nipple of the left breast that is painful. Denies any recent injury to breast. Has been performing hot soaks without relief. Reports left breast is slightly swollen and feels there is some discoloration under the lump. "I am unsure if it has always been that color or not." Denies any nipple discharge. Denies fever or chills. Advised she will need to be seen in the office for breast check with Dr.Silva. Patient is agreeable. Appointment scheduled for tomorrow 05/15/2016 at 3:45 pm with Dr.Silva. Patient declines earlier appointment. Advised if pain worsens or she develops any new symptoms she will need to be seen for earlier evaluation. Patient is agreeable.  Routing to provider for final review. Patient agreeable to disposition. Will close encounter.

## 2016-05-15 ENCOUNTER — Encounter: Payer: Self-pay | Admitting: Obstetrics and Gynecology

## 2016-05-15 ENCOUNTER — Ambulatory Visit (INDEPENDENT_AMBULATORY_CARE_PROVIDER_SITE_OTHER): Payer: BC Managed Care – PPO | Admitting: Obstetrics and Gynecology

## 2016-05-15 VITALS — BP 110/70 | HR 68 | Resp 16 | Ht 66.0 in | Wt 128.0 lb

## 2016-05-15 DIAGNOSIS — N6320 Unspecified lump in the left breast, unspecified quadrant: Secondary | ICD-10-CM

## 2016-05-15 DIAGNOSIS — N644 Mastodynia: Secondary | ICD-10-CM

## 2016-05-15 DIAGNOSIS — N63 Unspecified lump in breast: Secondary | ICD-10-CM | POA: Diagnosis not present

## 2016-05-15 NOTE — Progress Notes (Signed)
GYNECOLOGY  VISIT   HPI: 45 y.o.   Married  Caucasian  female   [phone] with Patient's last menstrual period was 05/09/2016.   here for pain in left breast x1 week; tender and swollen.  Has a current URI.   No fevers.   No trauma to breast.   Used hot compresses and this has helped somewhat.   Hurts to roll over on it.    Menses are a lot less bleeding.   GYNECOLOGIC HISTORY: Patient's last menstrual period was 05/09/2016. Contraception:  Vasectomy Menopausal hormone therapy:  n/a Last mammogram:  11-29-15 Density D/Neg/BiRads1: [facility] Last pap smear:   11/08/15 Neg:Neg HR HPV        OB History    Gravida Para Term Preterm AB Living   3 3 3     3    SAB TAB Ectopic Multiple Live Births                     Patient Active Problem List   Diagnosis Date Noted  . Health care maintenance 10/25/2011  . Bradycardia, sinus 10/25/2011    Past Medical History:  Diagnosis Date  . Bradycardia   . Varicose veins   . Vitiligo 2016    Past Surgical History:  Procedure Laterality Date  . GANGLION CYST EXCISION    . GANGLION CYST EXCISION Left   . WISDOM TOOTH EXTRACTION      Current Outpatient Prescriptions  Medication Sig Dispense Refill  . Multiple Vitamin (MULTIVITAMIN) tablet Take 1 tablet by mouth daily.     No current facility-administered medications for this visit.      ALLERGIES: Review of patient's allergies indicates no known allergies.  Family History  Problem Relation Age of Onset  . Breast cancer Maternal Grandmother     Social History   Social History  . Marital status: Married    Spouse name: N/A  . Number of children: N/A  . Years of education: N/A   Occupational History  . Not on file.   Social History Main Topics  . Smoking status: Never Smoker  . Smokeless tobacco: Never Used  . Alcohol use 0.0 oz/week     Comment: 1-2 drinks per week  . Drug use: No  . Sexual activity: Yes    Partners: Male     Comment: vasectomy    Other Topics Concern  . Not on file   Social History Narrative  . No narrative on file    ROS:  Pertinent items are noted in HPI.  PHYSICAL EXAMINATION:    BP 110/70 (BP Location: Right Arm, Patient Position: Sitting, Cuff Size: Normal)   Pulse 68   Resp 16   Ht 5\' 6"  (1.676 m)   Wt 128 lb (58.1 kg)   LMP 05/09/2016   BMI 20.66 kg/m     General appearance: alert, cooperative and appears stated age   Breasts: normal appearance, no masses or tenderness, No nipple retraction or dimpling, No nipple discharge or bleeding, No axillary or supraclavicular adenopathy on right breast.  Left breast with 2 cm tender, mobile smooth mass at 3:00 at edge of areola.  Second mass 0.75 cm at 3:00 lateral to the larger mass.    Chaperone was present for exam.  ASSESSMENT  Left breast pain.  Left breast masses.  PLAN  Discussion of breast masses.  Proceed with diagnostic left mammogram and ultrasound at [facility].  Discussed drainage of breast cyst(s) if desired.  An After Visit Summary was printed and given to the patient.  __15____ minutes face to face time of which over 50% was spent in counseling.

## 2016-05-17 ENCOUNTER — Ambulatory Visit
Admission: RE | Admit: 2016-05-17 | Discharge: 2016-05-17 | Disposition: A | Discharge status: Home / Self Care | Payer: BC Managed Care – PPO | Source: Ambulatory Visit | Attending: Obstetrics and Gynecology | Admitting: Obstetrics and Gynecology

## 2016-05-17 DIAGNOSIS — N644 Mastodynia: Secondary | ICD-10-CM

## 2016-05-17 DIAGNOSIS — N6320 Unspecified lump in the left breast, unspecified quadrant: Secondary | ICD-10-CM

## 2016-07-03 ENCOUNTER — Encounter: Payer: Self-pay | Admitting: Vascular Surgery

## 2016-07-09 ENCOUNTER — Encounter: Payer: Self-pay | Admitting: Vascular Surgery

## 2016-07-09 ENCOUNTER — Ambulatory Visit (INDEPENDENT_AMBULATORY_CARE_PROVIDER_SITE_OTHER): Payer: BC Managed Care – PPO | Admitting: Vascular Surgery

## 2016-07-09 VITALS — BP 122/82 | HR 59 | Temp 98.5°F | Resp 16 | Ht 66.0 in | Wt 128.3 lb

## 2016-07-09 DIAGNOSIS — I83811 Varicose veins of right lower extremities with pain: Secondary | ICD-10-CM

## 2016-07-09 NOTE — Progress Notes (Signed)
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    2. Mrs. Alexis Watts has difficulty with all activities that require prolonged standing (cooking, cleaning, shopping) due to leg pain.    3. Mrs. Alexis Watts has difficulty with activities that require prolonged sitting (traveling, sitting) due to leg pain.      Failure of  Conservative Therapy:  1. Worn 20-30 mm Hg thigh high compression hose >3 months with no relief of symptoms.  2. Frequently elevates legs-no relief of symptoms  3. Taken Ibuprofen 600 Mg TID with no relief of symptoms.                                        [facility] [place]  Patient name: Alexis Watts MRN: [id number] DOB: 07-12-1971 Sex: female  REASON FOR VISIT: Follow-up painful varicosities right leg  HPI: Alexis Watts is a 45 y.o. female here today for follow-up of painful right leg varicosities. 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 She has been quite compliant with her thigh graduated compression garments and elevation when possible and ibuprofen and continues to have severe discomfort associated with this. This is worse in the time of her menstrual cycle but is painful at other times of the month as well. She has a large plexus of varicosities that began in her proximal thigh and extend directly down the posterior thigh to the popliteal fossa and onto her calf. She has had no episodes of bleeding with this and has no episodes of DVT  Past Medical History:  Diagnosis Date  . Bradycardia   . Varicose veins   . Vitiligo 2016    Family History  Problem Relation Age of Onset  . Breast cancer Maternal Grandmother     SOCIAL HISTORY: Social History  Substance Use Topics  . Smoking status: Never Smoker  . Smokeless tobacco: Never Used  . Alcohol use 0.0 oz/week     Comment: 1-2 drinks per week    No Known  Allergies  Current Outpatient Prescriptions  Medication Sig Dispense Refill  . Multiple Vitamin (MULTIVITAMIN) tablet Take 1 tablet by mouth daily.     No current facility-administered medications for this visit.     REVIEW OF SYSTEMS:  [X]  denotes positive finding, [ ]  denotes negative finding Cardiac  Comments:  Chest pain or chest pressure:    Shortness of breath upon exertion:    Short of breath when lying flat:    Irregular heart rhythm:        Vascular    Pain in calf, thigh, or hip brought on by ambulation:    Pain in feet at night that wakes you up from your sleep:     Blood clot in your veins:    Leg swelling:           PHYSICAL EXAM: Vitals:   07/09/16 0848  BP: 122/82  Pulse: (!) 59  Resp: 16  Temp: 98.5 F (36.9 C)  TempSrc: Oral  SpO2: 100%  Weight: 128 lb 4.8 oz (58.2 kg)  Height: 5\' 6"  (1.676 m)    GENERAL: The patient is a well-nourished female, in no acute distress. The vital signs are documented above. CARDIOVASCULAR: 2+ dorsalis pedis pulses bilaterally PULMONARY:  There is good air exchange  MUSCULOSKELETAL: There are no major deformities or cyanosis. NEUROLOGIC: No focal weakness or paresthesias are detected. SKIN: There are no ulcers or rashes noted. PSYCHIATRIC: The patient has a normal affect. Large plexus of varicosities extending from the proximal posterior thigh through the popliteals fossa down to the posterior calf  DATA:  I reimaged this area with duplex SonoSite. It and also compare this to her formal duplex on 11/24/2015. There is a large plexus of varicosities throughout the course but there is no apparent communication to the saphenous vein or to any large perforator branches.  MEDICAL ISSUES: Failed conservative treatment of severely painful varicosities. I have recommended stab phlebectomy for removal of these large painful varicosities and symptom relief. Explained this would be done as an outpatient under local tumescent  anesthesia. She wishes to proceed as soon as possible.    Rosetta Posner, MD FACS [facility] Tel [phone] Pager [phone]

## 2016-07-16 ENCOUNTER — Telehealth: Payer: Self-pay | Admitting: *Deleted

## 2016-07-16 NOTE — Telephone Encounter (Signed)
Left detailed telephone voice message that [employer] of [place] has denied pre-certification for CPT [phone] (office surgery).  Denial of pre-certification for CPT [phone] was upheld by [facility] of [place] medical director, Sherle Poe MD, in peer-to-peer review with Dr. Donnetta Hutching this morning.  Will follow up with Mrs. Sorah about her options after they are clarified by [facility] office manager, Donald Pore.

## 2016-11-08 ENCOUNTER — Encounter: Payer: Self-pay | Admitting: Vascular Surgery

## 2016-11-14 ENCOUNTER — Ambulatory Visit (INDEPENDENT_AMBULATORY_CARE_PROVIDER_SITE_OTHER): Payer: BC Managed Care – PPO | Admitting: Vascular Surgery

## 2016-11-14 ENCOUNTER — Encounter: Payer: Self-pay | Admitting: Vascular Surgery

## 2016-11-14 VITALS — BP 125/82 | HR 57 | Temp 97.6°F | Resp 15 | Ht 66.0 in | Wt 130.0 lb

## 2016-11-14 DIAGNOSIS — I83811 Varicose veins of right lower extremities with pain: Secondary | ICD-10-CM | POA: Diagnosis not present

## 2016-11-14 NOTE — Progress Notes (Signed)
    Stab Phlebectomy Procedure  Alexis Watts DOB:Nov 29, 1970  11/14/2016  Consent signed: Yes  Surgeon:T.F. Amarionna Arca, MD  Procedure: stab phlebectomy: right leg  BP 125/82   Pulse (!) 57   Temp 97.6 F (36.4 C)   Resp 15   Ht 5\' 6"  (1.676 m)   Wt 130 lb (59 kg)   SpO2 100%   BMI 20.98 kg/m   Start time: 10:30   End time: 11:20   Tumescent Anesthesia: 400 cc 0.9% NaCl with 50 cc Lidocaine HCL with 1% Epi and 15 cc 8.4% NaHCO3  Local Anesthesia: 2 cc Lidocaine HCL and NaHCO3 (ratio 2:1)    Stab Phlebectomy: >20 Sites: Thigh  Patient tolerated procedure well: Yes  Notes:   Description of Procedure:  After marking the course of the secondary varicosities, the patient was placed on the operating table in the prone position, and the right leg was prepped and draped in sterile fashion.    The patient was then put into Trendelenburg position.  Local anesthetic was administered at the previously marked varicosities, and tumescent anesthesia was administered around the vessels.  Greater than 20 stab wounds were made using the tip of an 11 blade. And using the vein hook, the phlebectomies were performed using a hemostat to avulse the varicosities.  Adequate hemostasis was achieved, and steri strips were applied to the stab wound.      ABD pads and thigh high compression stockings were applied as well ace wraps where needed. Blood loss was less than 15 cc.  The patient ambulated out of the operating room having tolerated the procedure well.  Uneventful phlebectomy of multiple tributary varicosities from her distal calf to her proximal thigh all on the asked on the posterior aspect of her leg. Will be seen in 2 weeks for follow-up

## 2016-11-20 ENCOUNTER — Encounter: Payer: Self-pay | Admitting: Vascular Surgery

## 2016-11-28 ENCOUNTER — Ambulatory Visit: Payer: BC Managed Care – PPO | Admitting: Vascular Surgery

## 2016-12-10 ENCOUNTER — Encounter: Payer: Self-pay | Admitting: Vascular Surgery

## 2016-12-17 ENCOUNTER — Ambulatory Visit (INDEPENDENT_AMBULATORY_CARE_PROVIDER_SITE_OTHER): Payer: Self-pay | Admitting: Vascular Surgery

## 2016-12-17 ENCOUNTER — Encounter: Payer: Self-pay | Admitting: Vascular Surgery

## 2016-12-17 VITALS — BP 109/69 | HR 124 | Temp 97.6°F | Resp 16 | Ht 66.0 in | Wt 135.0 lb

## 2016-12-17 DIAGNOSIS — I83811 Varicose veins of right lower extremities with pain: Secondary | ICD-10-CM

## 2016-12-17 NOTE — Progress Notes (Signed)
   Patient name: Alexis Watts MRN: [id number] DOB: [birth year] Sex: female  REASON FOR VISIT: Follow-up stab phlebectomy of right leg varicosities  HPI: Alexis Watts is a 46 y.o. female her today for follow-up of stab phlebectomy of varicosities in her right posterior thigh. She had continued pain associated with this despite compression garments. She underwent uneventful stab phlebectomy of these painful varicosities under tumescent anesthesia one month ago. She reports an outstanding result. 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  Current Outpatient Prescriptions  Medication Sig Dispense Refill  . Multiple Vitamin (MULTIVITAMIN) tablet Take 1 tablet by mouth daily.     No current facility-administered medications for this visit.      PHYSICAL EXAM: Vitals:   12/17/16 0932  BP: 109/69  Pulse: (!) 124  Resp: 16  Temp: 97.6 F (36.4 C)  TempSrc: Oral  SpO2: 94%  Weight: 135 lb (61.2 kg)  Height: 5\' 6"  (1.676 m)    GENERAL: The patient is a well-nourished female, in no acute distress. The vital signs are documented above. Physical exam reveals a very nice result with healing of all the small stab incisions. The varicosities have been removed.  MEDICAL ISSUES: Agent is very pleased with the procedure. She is questioning her risk for recurrence over time. I feel that this is quite low since she did not have any reflux demonstrated in her saphenous vein. She will wear her graduated compression garments on an as needed a since only for comfort. Otherwise we'll see [place] again as needed   Rosetta Posner, MD [facility] Tel [phone] Pager [phone]

## 2017-05-19 ENCOUNTER — Telehealth: Payer: Self-pay | Admitting: Obstetrics and Gynecology

## 2017-05-19 NOTE — Telephone Encounter (Signed)
Patient found a painful lump in her left breast.

## 2017-05-20 NOTE — Telephone Encounter (Signed)
Spoke with patient at time of incoming call. Original call not routed to triage. Apologized to patient. Patient states she has a left breast lump that is larger than the size of a marble that is painful to the touch. Left breast is slightly swollen. Denies redness or warmth to the breast. States this has occurred before and it was a cyst that resolved. Advised will need to be seen for further evaluation. Appointment scheduled for 05/22/2017 at 3:30 pm with Dr.Silva. Declines all earlier appointment times. Aware if symptoms worsen or develops new symptoms will need to be seen earlier.  Routing to provider for final review. Patient agreeable to disposition. Will close encounter.

## 2017-05-22 ENCOUNTER — Encounter: Payer: Self-pay | Admitting: Obstetrics and Gynecology

## 2017-05-22 ENCOUNTER — Ambulatory Visit (INDEPENDENT_AMBULATORY_CARE_PROVIDER_SITE_OTHER): Payer: BC Managed Care – PPO | Admitting: Obstetrics and Gynecology

## 2017-05-22 VITALS — BP 106/60 | HR 64 | Ht 66.0 in | Wt 134.8 lb

## 2017-05-22 DIAGNOSIS — N6320 Unspecified lump in the left breast, unspecified quadrant: Secondary | ICD-10-CM

## 2017-05-22 DIAGNOSIS — N6310 Unspecified lump in the right breast, unspecified quadrant: Secondary | ICD-10-CM | POA: Diagnosis not present

## 2017-05-22 NOTE — Progress Notes (Signed)
Patient scheduled while in office for bilateral diagnostic MMG and left/right [place] if needed. Spoke with Cherish at [facility]. Patient request to schedule on 05/28/17. Scheduled for 10/10 arriving at 3pm for 3:20pm appointment. Patient scheduled for AEX 05/27/17 at 3:45pm. Patient agreeable to date and time.

## 2017-05-22 NOTE — Patient Instructions (Signed)
Fibrocystic Breast Changes Fibrocystic breast changes are changes in breast tissue that can cause breasts to become swollen, lumpy, or painful. This can happen due to buildup of scar-like tissue (fibrous tissue) or the forming of fluid-filled lumps (cysts) in the breast. This is a common condition, and it is not cancerous (is benign). The exact cause is not known, but it seems to occur when women go through hormonal changes during their menstrual cycle. Fibrocystic breast changes can affect one or both breasts. What are the causes? The exact cause of fibrocystic breast changes is not known. However, this condition:  May be related to the female hormones estrogen and progesterone.  May be influenced by family traits that get passed from parent to child (genetics).  What are the signs or symptoms? Symptoms of this condition may affect one or both breasts, and may include:  Tenderness, mild discomfort, or pain.  Swelling.  Rope-like tissue that can be felt when touching the breast.  Lumps in one or both breasts.  Changes in breast size. Breasts may get larger before the menstrual period and smaller after the menstrual period.  Green or dark brown discharge from the nipple.  Symptoms are usually worse before menstrual periods start, and they get better toward the end of menstrual periods. How is this diagnosed? This condition is diagnosed based on your medical history and a physical exam of your breasts. You may also have tests, such as:  A breast X-ray (mammogram).  Ultrasound of your breasts.  MRI.  Removal of a breast tissue sample for testing (breast biopsy). This may be done if your health care provider thinks that something else may be causing changes in your breasts.  How is this treated? Often, treatment is not needed for this condition. In some cases, treatment may include:  Taking over-the-counter pain relievers to help lessen pain or discomfort.  Limiting or avoiding  caffeine. Foods and beverages that contain caffeine include chocolate, soda, coffee, and tea.  Reducing sugar and fat in your diet.  Your health care provider may also recommend:  A procedure to remove fluid from a cyst that is causing pain (fine needle aspiration).  Surgery to remove a cyst that is large or tender or does not go away.  Follow these instructions at home:  Examine your breasts after every menstrual period. If you do not have menstrual periods, check your breasts on the first day of every month. Feel for changes in your breasts, such as: ? More tenderness. ? A new growth. ? A change in size. ? A change in an existing lump.  Take over-the-counter and prescription medicines only as told by your health care provider.  Wear a well-fitted support or sports bra, especially when exercising.  Decrease or avoid caffeine, fat, and sugar in your diet as directed by your health care provider. Contact a health care provider if:  You have fluid leaking from your nipple, especially if it is bloody.  You have new lumps or bumps in your breast.  Your breast becomes enlarged, red, and painful.  You have areas of your breast that pucker inward.  Your nipple appears flat or indented. Get help right away if:  You have redness of your breast and the redness is spreading. Summary  Fibrocystic breast changes are changes in breast tissue that can cause breasts to become swollen, lumpy, or painful.  This condition may be related to the female hormones estrogen and progesterone.  With this condition, it is important to examine   your breasts after every menstrual period. If you do not have menstrual periods, check your breasts on the first day of every month. This information is not intended to replace advice given to you by your health care provider. Make sure you discuss any questions you have with your health care provider. Document Released: 05/22/2006 Document Revised: 04/16/2016  Document Reviewed: 04/03/2016 Elsevier Interactive Patient Education  2017 Elsevier Inc.   

## 2017-05-22 NOTE — Progress Notes (Signed)
GYNECOLOGY  VISIT   HPI: 46 y.o.   Married  Caucasian  female   [phone] with Patient's last menstrual period was 05/03/2017 (exact date).   here for left breast lump. Woke up about 10 days ago and noted a large lump, which is now smaller.  Seen one year ago for left breast pain and 2 lumps at 3:00 at edge of areola and at 3:00 lateral to that mass.  She had a diagnostic mammogram and ultrasound and had  Benign appearing cysts.  GYNECOLOGIC HISTORY: Patient's last menstrual period was 05/03/2017 (exact date). Contraception:  vasectomy Menopausal hormone therapy:  n/a Last mammogram: 05-17-16 Lt.Br.Diag.and U/S--benign appearing cysts/BiRads2:TBC 11-29-15 Density D/Neg/BiRads1:TBC Last pap smear:  3-2 2-17 Neg:Neg HR HPV        OB History    Gravida Para Term Preterm AB Living   3 3 3     3    SAB TAB Ectopic Multiple Live Births                     Patient Active Problem List   Diagnosis Date Noted  . Health care maintenance 10/25/2011  . Bradycardia, sinus 10/25/2011    Past Medical History:  Diagnosis Date  . Bradycardia   . Varicose veins   . Vitiligo 2016    Past Surgical History:  Procedure Laterality Date  . GANGLION CYST EXCISION    . GANGLION CYST EXCISION Left   . WISDOM TOOTH EXTRACTION      Current Outpatient Prescriptions  Medication Sig Dispense Refill  . Multiple Vitamin (MULTIVITAMIN) tablet Take 1 tablet by mouth daily.     No current facility-administered medications for this visit.      ALLERGIES: Patient has no known allergies.  Family History  Problem Relation Age of Onset  . Breast cancer Maternal Grandmother     Social History   Social History  . Marital status: Married    Spouse name: N/A  . Number of children: N/A  . Years of education: N/A   Occupational History  . Not on file.   Social History Main Topics  . Smoking status: Never Smoker  . Smokeless tobacco: Never Used  . Alcohol use 0.0 oz/week     Comment: 1-2 drinks  per week  . Drug use: No  . Sexual activity: Yes    Partners: Male     Comment: vasectomy   Other Topics Concern  . Not on file   Social History Narrative  . No narrative on file    ROS:  Pertinent items are noted in HPI.  PHYSICAL EXAMINATION:    BP 106/60 (BP Location: Right Arm, Patient Position: Sitting, Cuff Size: Normal)   Pulse 64   Ht 5\' 6"  (1.676 m)   Wt 134 lb 12.8 oz (61.1 kg)   LMP 05/03/2017 (Exact Date)   BMI 21.76 kg/m     General appearance: alert, cooperative and appears stated age   Breasts: right breast 4 cm lump at 8 - 9:00 position and left breast with 2 cm lump at 3:00 position, No nipple retraction or dimpling, No nipple discharge or bleeding, No axillary or supraclavicular adenopathy   Chaperone was present for exam.  ASSESSMENT  Bilateral breast lumps.  Hx of breast cysts.   PLAN  Discussed fibrocystic breast change.  Bilateral dx mammogram and breast ultrasound.  Hot compresses.  Ibuprofen prn.  Return for annual exam.   An After Visit Summary was printed and given to the patient.  __15____ minutes face to face time of which over 50% was spent in counseling.

## 2017-05-23 ENCOUNTER — Telehealth: Payer: Self-pay | Admitting: *Deleted

## 2017-05-23 ENCOUNTER — Other Ambulatory Visit: Payer: Self-pay | Admitting: *Deleted

## 2017-05-23 DIAGNOSIS — N6320 Unspecified lump in the left breast, unspecified quadrant: Secondary | ICD-10-CM

## 2017-05-23 DIAGNOSIS — N6310 Unspecified lump in the right breast, unspecified quadrant: Secondary | ICD-10-CM

## 2017-05-23 NOTE — Telephone Encounter (Signed)
Spoke with Alexis Watts at [facility]. Patient previously scheduled for imaging on 05/28/17. Advised appointment will stay as scheduled, new order placed to reflect bilateral diagnostic MMG and left and right breast US if needed.   Appointment updated by Alexis Watts, order linked.   Routing to provider for final review. Will close encounter.

## 2017-05-28 ENCOUNTER — Ambulatory Visit
Admission: RE | Admit: 2017-05-28 | Discharge: 2017-05-28 | Disposition: A | Discharge status: Home / Self Care | Payer: BC Managed Care – PPO | Source: Ambulatory Visit | Attending: Obstetrics and Gynecology | Admitting: Obstetrics and Gynecology

## 2017-05-28 DIAGNOSIS — N6310 Unspecified lump in the right breast, unspecified quadrant: Secondary | ICD-10-CM

## 2017-05-28 DIAGNOSIS — N6320 Unspecified lump in the left breast, unspecified quadrant: Secondary | ICD-10-CM

## 2017-06-17 ENCOUNTER — Other Ambulatory Visit: Payer: Self-pay | Admitting: Obstetrics and Gynecology

## 2017-06-17 DIAGNOSIS — N6001 Solitary cyst of right breast: Secondary | ICD-10-CM

## 2017-06-20 ENCOUNTER — Other Ambulatory Visit: Payer: BC Managed Care – PPO

## 2017-06-23 DIAGNOSIS — Z0000 Encounter for general adult medical examination without abnormal findings: Secondary | ICD-10-CM | POA: Diagnosis not present

## 2017-06-23 DIAGNOSIS — Z136 Encounter for screening for cardiovascular disorders: Secondary | ICD-10-CM | POA: Diagnosis not present

## 2017-06-23 DIAGNOSIS — N6001 Solitary cyst of right breast: Secondary | ICD-10-CM | POA: Diagnosis not present

## 2017-06-23 DIAGNOSIS — M722 Plantar fascial fibromatosis: Secondary | ICD-10-CM | POA: Diagnosis not present

## 2017-06-24 ENCOUNTER — Other Ambulatory Visit: Payer: BC Managed Care – PPO

## 2017-10-16 DIAGNOSIS — L80 Vitiligo: Secondary | ICD-10-CM | POA: Diagnosis not present

## 2017-10-16 DIAGNOSIS — L821 Other seborrheic keratosis: Secondary | ICD-10-CM | POA: Diagnosis not present

## 2017-10-16 DIAGNOSIS — D225 Melanocytic nevi of trunk: Secondary | ICD-10-CM | POA: Diagnosis not present

## 2018-02-03 DIAGNOSIS — L570 Actinic keratosis: Secondary | ICD-10-CM | POA: Diagnosis not present

## 2018-02-03 DIAGNOSIS — L814 Other melanin hyperpigmentation: Secondary | ICD-10-CM | POA: Diagnosis not present

## 2018-05-17 DIAGNOSIS — M25511 Pain in right shoulder: Secondary | ICD-10-CM | POA: Diagnosis not present

## 2018-05-21 DIAGNOSIS — Z23 Encounter for immunization: Secondary | ICD-10-CM | POA: Diagnosis not present

## 2018-05-21 DIAGNOSIS — M25511 Pain in right shoulder: Secondary | ICD-10-CM | POA: Diagnosis not present

## 2018-05-27 ENCOUNTER — Ambulatory Visit: Payer: 59 | Admitting: Obstetrics and Gynecology

## 2018-05-27 ENCOUNTER — Other Ambulatory Visit: Payer: Self-pay

## 2018-05-27 ENCOUNTER — Encounter: Payer: Self-pay | Admitting: Obstetrics and Gynecology

## 2018-05-27 VITALS — BP 100/58 | HR 60 | Resp 14 | Ht 66.0 in | Wt 135.6 lb

## 2018-05-27 DIAGNOSIS — Z1211 Encounter for screening for malignant neoplasm of colon: Secondary | ICD-10-CM

## 2018-05-27 DIAGNOSIS — Z01419 Encounter for gynecological examination (general) (routine) without abnormal findings: Secondary | ICD-10-CM

## 2018-05-27 NOTE — Patient Instructions (Signed)

## 2018-05-27 NOTE — Progress Notes (Signed)
47 y.o. [id number] Married Caucasian female here for annual exam.    Regular menses.   Having right shoulder issues with a torn tendon and rotator cuff. Does not play tennis anymore.   Labs with PCP last spring.   PCP:  Alexis Watts @ [facility]   Patient's last menstrual period was 05/21/2018 (exact date).     Period Cycle (Days): 30 Period Duration (Days): 5 days Period Pattern: Regular Menstrual Flow: (heavy x2 days and then tapers) Menstrual Control: Tampon, Maxi pad Dysmenorrhea: (!) Mild Dysmenorrhea Symptoms: Cramping     Sexually active: Yes.    The current method of family planning is vasectomy.    Exercising: Yes.    yoga, walking and running. Smoker:  no  Health Maintenance: Pap: 11-08-15 Neg:Neg HR HPV, 11-27-12 Neg:Neg HR HPV  History of abnormal Pap:  no MMG: 05-28-17 Diag.Bil.w/Bil.U/S/bil.simple cysts/BiRads2/screening 1 year.  She will schedule.  Colonoscopy: 2005 Neg;will repeat age 41 BMD:   n/a  Result  n/a TDaP:  PCP Gardasil:   no HIV: Neg 20 years ago Hep C:Unsure Screening Labs:  Hb today: PCP. Flu vaccine was done last week.    reports that she has never smoked. She has never used smokeless tobacco. She reports that she drinks about 2.0 standard drinks of alcohol per week. She reports that she does not use drugs.  Past Medical History:  Diagnosis Date  . Bradycardia   . Fibroids, subserous    1.4 cm.  . Varicose veins   . Vitiligo 2016    Past Surgical History:  Procedure Laterality Date  . GANGLION CYST EXCISION    . GANGLION CYST EXCISION Left   . WISDOM TOOTH EXTRACTION      Current Outpatient Medications  Medication Sig Dispense Refill  . Multiple Vitamin (MULTIVITAMIN) tablet Take 1 tablet by mouth daily.     No current facility-administered medications for this visit.     Family History  Problem Relation Age of Onset  . Breast cancer Maternal Grandmother     Review of Systems  Constitutional: Negative.   HENT: Negative.   Eyes:  Negative.   Respiratory: Negative.   Cardiovascular: Negative.   Gastrointestinal: Negative.   Endocrine: Negative.   Genitourinary: Negative.   Musculoskeletal: Positive for joint swelling.       Muscle pain  Skin: Negative.   Allergic/Immunologic: Negative.   Neurological: Negative.   Hematological: Negative.   Psychiatric/Behavioral: Negative.     Exam:   BP (!) 100/58 (BP Location: Right Arm, Patient Position: Sitting, Cuff Size: Normal)   Pulse 60   Resp 14   Ht 5\' 6"  (1.676 m)   Wt 135 lb 9.6 oz (61.5 kg)   LMP 05/21/2018 (Exact Date)   BMI 21.89 kg/m     General appearance: alert, cooperative and appears stated age Head: Normocephalic, without obvious abnormality, atraumatic Neck: no adenopathy, supple, symmetrical, trachea midline and thyroid normal to inspection and palpation Lungs: clear to auscultation bilaterally Breasts: normal appearance, no masses or tenderness, No nipple retraction or dimpling, No nipple discharge or bleeding, No axillary or supraclavicular adenopathy Heart: regular rate and rhythm Abdomen: soft, non-tender; no masses, no organomegaly Extremities: extremities normal, atraumatic, no cyanosis or edema Skin: Skin color, texture, turgor normal. No rashes or lesions Lymph nodes: Cervical, supraclavicular, and axillary nodes normal. No abnormal inguinal nodes palpated Neurologic: Grossly normal  Pelvic: External genitalia:  no lesions              Urethra:  normal  appearing urethra with no masses, tenderness or lesions              Bartholins and Skenes: normal                 Vagina: normal appearing vagina with normal color and discharge, no lesions              Cervix: no lesions              Pap taken: No. Bimanual Exam:  Uterus:  normal size, contour, position, consistency, mobility, non-tender              Adnexa: no mass, fullness, tenderness              Rectal exam: Yes.  .  Confirms.              Anus:  normal sphincter tone, no  lesions  Chaperone was present for exam.  Assessment:   Well woman visit with normal exam. Dense breast tissue - cat D.  Small subserous fibroid on prior US.   Plan: Mammogram screening.  I discussed 3D.  Recommended self breast awareness. Pap and HR HPV as above. Guidelines for Calcium, Vitamin D, regular exercise program including cardiovascular and weight bearing exercise. IFOB.  Labs with PCP.  Follow up annually and prn.   After visit summary provided.

## 2018-05-28 DIAGNOSIS — M4722 Other spondylosis with radiculopathy, cervical region: Secondary | ICD-10-CM | POA: Diagnosis not present

## 2018-05-28 DIAGNOSIS — S46811A Strain of other muscles, fascia and tendons at shoulder and upper arm level, right arm, initial encounter: Secondary | ICD-10-CM | POA: Diagnosis not present

## 2018-07-27 ENCOUNTER — Other Ambulatory Visit: Payer: Self-pay | Admitting: Obstetrics and Gynecology

## 2018-07-27 DIAGNOSIS — Z1231 Encounter for screening mammogram for malignant neoplasm of breast: Secondary | ICD-10-CM

## 2018-08-10 DIAGNOSIS — Z1211 Encounter for screening for malignant neoplasm of colon: Secondary | ICD-10-CM | POA: Diagnosis not present

## 2018-08-11 LAB — FECAL OCCULT BLOOD, IMMUNOCHEMICAL: Fecal Occult Bld: NEGATIVE

## 2018-08-11 LAB — SPECIMEN STATUS REPORT

## 2018-09-03 ENCOUNTER — Ambulatory Visit
Admission: RE | Admit: 2018-09-03 | Discharge: 2018-09-03 | Disposition: A | Discharge status: Home / Self Care | Payer: 59 | Source: Ambulatory Visit | Attending: Obstetrics and Gynecology | Admitting: Obstetrics and Gynecology

## 2018-09-03 DIAGNOSIS — Z1231 Encounter for screening mammogram for malignant neoplasm of breast: Secondary | ICD-10-CM | POA: Diagnosis not present

## 2019-03-23 ENCOUNTER — Other Ambulatory Visit: Payer: Self-pay

## 2019-03-23 ENCOUNTER — Telehealth: Payer: Self-pay | Admitting: Obstetrics and Gynecology

## 2019-03-23 ENCOUNTER — Other Ambulatory Visit: Payer: Self-pay | Admitting: Obstetrics and Gynecology

## 2019-03-23 ENCOUNTER — Ambulatory Visit: Payer: 59 | Admitting: Obstetrics and Gynecology

## 2019-03-23 ENCOUNTER — Encounter: Payer: Self-pay | Admitting: Obstetrics and Gynecology

## 2019-03-23 VITALS — BP 108/74 | HR 66 | Temp 97.5°F | Resp 14 | Ht 66.0 in | Wt 135.1 lb

## 2019-03-23 DIAGNOSIS — N6002 Solitary cyst of left breast: Secondary | ICD-10-CM | POA: Diagnosis not present

## 2019-03-23 DIAGNOSIS — N63 Unspecified lump in unspecified breast: Secondary | ICD-10-CM

## 2019-03-23 NOTE — Telephone Encounter (Signed)
Please schedule a dx left mammogram and left breast US at [facility].  Patient has a large left breast mass 4 cm at 2 - 4 o'clock.  She has a history of breast cysts.  I tried to drain this, but was unsuccessful in the office.

## 2019-03-23 NOTE — Patient Instructions (Signed)
Please call for fever, redness of the breast, or increasing pain.

## 2019-03-23 NOTE — Telephone Encounter (Signed)
Spoke with Alexis Watts at [facility]. Left breast Dx MMG and [place] scheduled for 8/6 at 2:50pm, arriving at 2:30pm.

## 2019-03-23 NOTE — Progress Notes (Signed)
GYNECOLOGY  VISIT   HPI: 48 y.o.   Married  Caucasian  female   [phone] with Patient's last menstrual period was 03/05/2019.   here for   Left breast mass noted a few weeks ago.   Tender.  Hx bilateral breast cysts.  No prior drainage of them.  Uses hot compresses and cysts usually go away.   No HRT.   Normal regular menses.   GYNECOLOGIC HISTORY: Patient's last menstrual period was 03/05/2019. Contraception:  Vasectomy  Menopausal hormone therapy:  none Last mammogram:  09-03-2018 density C/BIRADS 1 negative  Last pap smear:   11-08-15 negative, HR HPV negative         OB History    Gravida  3   Para  3   Term  3   Preterm      AB      Living  3     SAB      TAB      Ectopic      Multiple      Live Births                 Patient Active Problem List   Diagnosis Date Noted  . Health care maintenance 10/25/2011  . Bradycardia, sinus 10/25/2011    Past Medical History:  Diagnosis Date  . Bradycardia   . Fibroids, subserous    1.4 cm.  . Varicose veins   . Vitiligo 2016    Past Surgical History:  Procedure Laterality Date  . GANGLION CYST EXCISION    . GANGLION CYST EXCISION Left   . WISDOM TOOTH EXTRACTION      Current Outpatient Medications  Medication Sig Dispense Refill  . Multiple Vitamin (MULTIVITAMIN) tablet Take 1 tablet by mouth daily.     No current facility-administered medications for this visit.      ALLERGIES: Patient has no known allergies.  Family History  Problem Relation Age of Onset  . Breast cancer Maternal Grandmother        late 25s    Social History   Socioeconomic History  . Marital status: Married    Spouse name: Not on file  . Number of children: Not on file  . Years of education: Not on file  . Highest education level: Not on file  Occupational History  . Not on file  Social Needs  . Financial resource strain: Not on file  . Food insecurity    Worry: Not on file    Inability: Not on file  .  Transportation needs    Medical: Not on file    Non-medical: Not on file  Tobacco Use  . Smoking status: Never Smoker  . Smokeless tobacco: Never Used  Substance and Sexual Activity  . Alcohol use: Yes    Alcohol/week: 2.0 standard drinks    Types: 2 Glasses of wine per week    Comment: 1-2 drinks per week  . Drug use: No  . Sexual activity: Yes    Partners: Male    Birth control/protection: Condom    Comment: vasectomy  Lifestyle  . Physical activity    Days per week: Not on file    Minutes per session: Not on file  . Stress: Not on file  Relationships  . Social Herbalist on phone: Not on file    Gets together: Not on file    Attends religious service: Not on file    Active member of club or organization: Not on  file    Attends meetings of clubs or organizations: Not on file    Relationship status: Not on file  . Intimate partner violence    Fear of current or ex partner: Not on file    Emotionally abused: Not on file    Physically abused: Not on file    Forced sexual activity: Not on file  Other Topics Concern  . Not on file  Social History Narrative  . Not on file    Review of Systems  Constitutional: Negative.   HENT: Negative.   Eyes: Negative.   Respiratory: Negative.   Cardiovascular: Negative.   Gastrointestinal: Negative.   Endocrine: Negative.   Genitourinary:       Tender left breast mass  Musculoskeletal: Negative.   Skin: Negative.   Allergic/Immunologic: Negative.   Neurological: Negative.   Hematological: Negative.   Psychiatric/Behavioral: Negative.     PHYSICAL EXAMINATION:    BP 108/74 (BP Location: Right Arm, Patient Position: Sitting, Cuff Size: Normal)   Pulse 66   Temp (!) 97.5 F (36.4 C) (Skin)   Resp 14   Ht 5\' 6"  (1.676 m)   Wt 135 lb 1.6 oz (61.3 kg)   LMP 03/05/2019   BMI 21.81 kg/m     General appearance: alert, cooperative and appears stated age   Breasts: Right - normal appearance, no masses or  tenderness, No nipple retraction or dimpling, No nipple discharge or bleeding, No axillary adenopathy. Left - 4 cm mass at 2:00 - 4:00.  Mobile, nontender.  No nipple retraction or dimpling, No nipple discharge or bleeding, No axillary adenopathy.  Procedure -  Attempted drainage of left breast cyst.  Consent for procedure.  Sterile prep with betadine.  Local 1% lidocaine, log [id number], expiration 02/17/20. Attempted drainage x 2.   Minimal fluid noted.  Blood tinged.  Procedure tolerated, but abandoned for lack of fluid obtained.  No complications.  Minimal EBL.  Chaperone was present for exam.  ASSESSMENT  Left breast mass.  Suspect this is cystic in nature due to sudden onset and prior hx of cysts.   PLAN  Call for redness, fever, or increasing pain.  Will proceed with dx left mammogram and left breast US.  Fu prn.   An After Visit Summary was printed and given to the patient.  ___15___ minutes face to face time of which over 50% was spent in counseling.

## 2019-03-23 NOTE — Telephone Encounter (Signed)
Spoke with patient, advised of appt details as seen below. Patient is agreeable to date and time.   Routing to Dr. Quincy Simmonds to sign off on imaging orders.

## 2019-03-24 ENCOUNTER — Other Ambulatory Visit: Payer: Self-pay | Admitting: Family Medicine

## 2019-03-25 ENCOUNTER — Other Ambulatory Visit: Payer: 59

## 2019-03-31 ENCOUNTER — Ambulatory Visit
Admission: RE | Admit: 2019-03-31 | Discharge: 2019-03-31 | Disposition: A | Discharge status: Home / Self Care | Payer: 59 | Source: Ambulatory Visit | Attending: Obstetrics and Gynecology | Admitting: Obstetrics and Gynecology

## 2019-03-31 ENCOUNTER — Other Ambulatory Visit: Payer: Self-pay

## 2019-03-31 DIAGNOSIS — N63 Unspecified lump in unspecified breast: Secondary | ICD-10-CM

## 2019-05-31 ENCOUNTER — Ambulatory Visit: Payer: 59 | Admitting: Obstetrics and Gynecology

## 2019-10-16 ENCOUNTER — Ambulatory Visit: Payer: 59 | Attending: Internal Medicine

## 2019-10-16 DIAGNOSIS — Z23 Encounter for immunization: Secondary | ICD-10-CM

## 2019-10-16 NOTE — Progress Notes (Signed)
   Covid-19 Vaccination Clinic  Name:  Alexis Watts    MRN: [id number] DOB: 04-Jul-1971  10/16/2019  [name] was observed post Covid-19 immunization for 15 minutes without incidence. She was provided with Vaccine Information Sheet and instruction to access the V-Safe system.   [name] was instructed to call 911 with any severe reactions post vaccine: Marland Kitchen Difficulty breathing  . Swelling of your face and throat  . A fast heartbeat  . A bad rash all over your body  . Dizziness and weakness    Immunizations Administered    Name Date Dose VIS Date Route   Pfizer COVID-19 Vaccine 10/16/2019  3:38 PM 0.3 mL 07/30/2019 Intramuscular   Manufacturer: [place]   Lot: [id number]   [place]: [id number]

## 2019-11-06 ENCOUNTER — Ambulatory Visit: Payer: 59 | Attending: Internal Medicine

## 2019-11-06 DIAGNOSIS — Z23 Encounter for immunization: Secondary | ICD-10-CM

## 2019-11-06 NOTE — Progress Notes (Signed)
   Covid-19 Vaccination Clinic  Name:  Alexis Watts    MRN: [id number] DOB: Dec 20, 1970  11/06/2019  Alexis Watts was observed post Covid-19 immunization for 15 minutes without incident. She was provided with Vaccine Information Sheet and instruction to access the V-Safe system.   Alexis Watts was instructed to call 911 with any severe reactions post vaccine: Marland Kitchen Difficulty breathing  . Swelling of face and throat  . A fast heartbeat  . A bad rash all over body  . Dizziness and weakness   Immunizations Administered    Name Date Dose VIS Date Route   Pfizer COVID-19 Vaccine 11/06/2019  8:40 AM 0.3 mL 07/30/2019 Intramuscular   Manufacturer: [place]   Lot: [id number]   [place]: [id number]

## 2019-12-14 ENCOUNTER — Other Ambulatory Visit: Payer: Self-pay

## 2019-12-14 NOTE — Progress Notes (Signed)
49 y.o. G31P3003 Married Caucasian female here for annual exam.    Regular menses.  Heavy bleeding on her second day of her cycle.  No pain.   Denies migraine with aura, HTN, liver disease, personal or family hx of thromboembolic events.  Hx varicose veins. She is due for a mammogram.  Father in law passed from [place].  Her brother had Covid and had respiratory failure.   Patient complaining of pain in coccyx x 4 months. Occurring since Thanksgiving. No injury.   Doing in person teaching since the fall.  PCP: Milagros Evener, MD    Patient's last menstrual period was 12/06/2019 (exact date).           Sexually active: Yes.    The current method of family planning is vasectomy.     Exercising: Yes.    running, walking and yoga Smoker:  no  Health Maintenance: Pap: 11-08-15 Neg:Neg HR HPV, 11-27-12 Neg:Neg HR HPV, 02-04-11 Neg History of abnormal Pap:  no MMG: 09-03-18 3D/neg/density C/BiRads1. Patient with Diag.Lt.Br.w/US 03-31-19/benign cyst Lt.Br./BiRads2--screening 08/2019 Colonoscopy:2005 Neg;repeat age 41--she will call GI to schedule BMD:   n/a  Result  n/a TDaP:  PCP Gardasil:   no HIV:Neg over 20 years ago Hep C: Unsure Screening Labs:  Today.   reports that she has never smoked. She has never used smokeless tobacco. She reports current alcohol use of about 2.0 standard drinks of alcohol per week. She reports that she does not use drugs.  Past Medical History:  Diagnosis Date  . Bradycardia   . Fibroids, subserous    1.4 cm.  . Varicose veins   . Vitiligo 2016    Past Surgical History:  Procedure Laterality Date  . GANGLION CYST EXCISION    . GANGLION CYST EXCISION Left   . WISDOM TOOTH EXTRACTION      Current Outpatient Medications  Medication Sig Dispense Refill  . Multiple Vitamin (MULTIVITAMIN) tablet Take 1 tablet by mouth daily.     No current facility-administered medications for this visit.    Family History  Problem Relation Age of Onset  .  Atrial fibrillation Mother 39  . Breast cancer Maternal Grandmother        late 27s    Review of Systems  All other systems reviewed and are negative.   Exam:   BP 110/70   Pulse 60   Temp 97.6 F (36.4 C) (Temporal)   Resp 12   Ht 5\' 6"  (1.676 m)   Wt 134 lb 3.2 oz (60.9 kg)   LMP 12/06/2019 (Exact Date)   BMI 21.66 kg/m     General appearance: alert, cooperative and appears stated age Head: normocephalic, without obvious abnormality, atraumatic Neck: no adenopathy, supple, symmetrical, trachea midline and thyroid normal to inspection and palpation Lungs: clear to auscultation bilaterally Breasts: normal appearance, no masses or tenderness, No nipple retraction or dimpling, No nipple discharge or bleeding, No axillary adenopathy Heart: regular rate and rhythm Abdomen: soft, non-tender; no masses, no organomegaly Extremities: extremities normal, atraumatic, no cyanosis or edema Skin: skin color, texture, turgor normal. No rashes or lesions Lymph nodes: cervical, supraclavicular, and axillary nodes normal. Neurologic: grossly normal  Pelvic: External genitalia:  no lesions              No abnormal inguinal nodes palpated.              Urethra:  normal appearing urethra with no masses, tenderness or lesions  Bartholins and Skenes: normal                 Vagina: normal appearing vagina with normal color and discharge, no lesions              Cervix: no lesions              Pap taken: No. Bimanual Exam:  Uterus:  normal size, contour, position, consistency, mobility, non-tender              Adnexa: no mass, fullness, tenderness              Rectal exam: Yes.  .  Confirms.              Anus:  normal sphincter tone, no lesions  Chaperone was present for exam.  Assessment:   Well woman visit with normal exam. Dense breast tissue.  Subserous fibroid on prior US.  Coccyx pain.   Plan: Mammogram screening discussed.  She will schedule.  Self breast awareness  reviewed. Pap and HR HPV as above. Guidelines for Calcium, Vitamin D, regular exercise program including cardiovascular and weight bearing exercise. She will see her PCP for her coccyx pain.  Referral to GI for colonoscopy. Follow up annually and prn.  After visit summary provided.

## 2019-12-15 ENCOUNTER — Encounter: Payer: Self-pay | Admitting: Obstetrics and Gynecology

## 2019-12-15 ENCOUNTER — Ambulatory Visit: Payer: 59 | Admitting: Obstetrics and Gynecology

## 2019-12-15 VITALS — BP 110/70 | HR 60 | Temp 97.6°F | Resp 12 | Ht 66.0 in | Wt 134.2 lb

## 2019-12-15 DIAGNOSIS — Z1211 Encounter for screening for malignant neoplasm of colon: Secondary | ICD-10-CM

## 2019-12-15 DIAGNOSIS — Z01419 Encounter for gynecological examination (general) (routine) without abnormal findings: Secondary | ICD-10-CM | POA: Diagnosis not present

## 2019-12-15 NOTE — Patient Instructions (Signed)

## 2019-12-16 LAB — COMPREHENSIVE METABOLIC PANEL
ALT: 15 IU/L (ref 0–32)
AST: 19 IU/L (ref 0–40)
Albumin/Globulin Ratio: 1.8 (ref 1.2–2.2)
Albumin: 4.3 g/dL (ref 3.8–4.8)
Alkaline Phosphatase: 67 IU/L (ref 39–117)
BUN/Creatinine Ratio: 17 (ref 9–23)
BUN: 12 mg/dL (ref 6–24)
Bilirubin Total: 0.3 mg/dL (ref 0.0–1.2)
CO2: 25 mmol/L (ref 20–29)
Calcium: 9.3 mg/dL (ref 8.7–10.2)
Chloride: 103 mmol/L (ref 96–106)
Creatinine, Ser: 0.69 mg/dL (ref 0.57–1.00)
GFR calc Af Amer: 117 mL/min/{1.73_m2} (ref >59)
GFR calc non Af Amer: 102 mL/min/{1.73_m2} (ref >59)
Globulin, Total: 2.4 g/dL (ref 1.5–4.5)
Glucose: 82 mg/dL (ref 65–99)
Potassium: 3.9 mmol/L (ref 3.5–5.2)
Sodium: 141 mmol/L (ref 134–144)
Total Protein: 6.7 g/dL (ref 6.0–8.5)

## 2019-12-16 LAB — LIPID PANEL
Chol/HDL Ratio: 2.5 ratio (ref 0.0–4.4)
Cholesterol, Total: 157 mg/dL (ref 100–199)
HDL: 64 mg/dL (ref >39)
LDL Chol Calc (NIH): 80 mg/dL (ref 0–99)
Triglycerides: 67 mg/dL (ref 0–149)
VLDL Cholesterol Cal: 13 mg/dL (ref 5–40)

## 2019-12-16 LAB — CBC
Hematocrit: 38.4 % (ref 34.0–46.6)
Hemoglobin: 12.2 g/dL (ref 11.1–15.9)
MCH: 27 pg (ref 26.6–33.0)
MCHC: 31.8 g/dL (ref 31.5–35.7)
MCV: 85 fL (ref 79–97)
Platelets: 228 10*3/uL (ref 150–450)
RBC: 4.52 x10E6/uL (ref 3.77–5.28)
RDW: 12.9 % (ref 11.7–15.4)
WBC: 6.1 10*3/uL (ref 3.4–10.8)

## 2019-12-16 LAB — VITAMIN D 25 HYDROXY (VIT D DEFICIENCY, FRACTURES): Vit D, 25-Hydroxy: 30.2 ng/mL (ref 30.0–100.0)

## 2020-12-19 ENCOUNTER — Other Ambulatory Visit (HOSPITAL_COMMUNITY)
Admission: RE | Admit: 2020-12-19 | Discharge: 2020-12-19 | Disposition: A | Discharge status: Home / Self Care | Payer: 59 | Source: Ambulatory Visit | Attending: Obstetrics and Gynecology | Admitting: Obstetrics and Gynecology

## 2020-12-19 ENCOUNTER — Encounter: Payer: Self-pay | Admitting: Obstetrics and Gynecology

## 2020-12-19 ENCOUNTER — Other Ambulatory Visit: Payer: Self-pay

## 2020-12-19 ENCOUNTER — Ambulatory Visit (INDEPENDENT_AMBULATORY_CARE_PROVIDER_SITE_OTHER): Payer: 59 | Admitting: Obstetrics and Gynecology

## 2020-12-19 VITALS — BP 100/66 | HR 67 | Ht 66.5 in | Wt 135.0 lb

## 2020-12-19 DIAGNOSIS — Z01419 Encounter for gynecological examination (general) (routine) without abnormal findings: Secondary | ICD-10-CM

## 2020-12-19 NOTE — Progress Notes (Signed)
50 y.o. G89P3003 Married Caucasian female here for annual exam.    Patient complaining of heavy cycles for a couple of days every month.  Is doing ok with this.  Painful for only one day.  She did miss a menses this year. Declines evaluation or treatment.   Has had some short lived hot flashes.  Some diarrhea around the heavy days of her menstruation.   Received her Covid boosters.   Taking vit D daily.   PCP:  Milagros Evener, MD   Patient's last menstrual period was 12/12/2020 (exact date).     Period Cycle (Days): 30 Period Duration (Days): 6 Period Pattern: Regular Menstrual Flow: Heavy Menstrual Control: Tampon,Maxi pad Menstrual Control Change Freq (Hours): changes super plus tampon and super maxi every 45 minutes to 1hr on heaviest day Dysmenorrhea: None (loose stools day of heaviest bleeding)     Sexually active: Yes.    The current method of family planning is vasectomy.    Exercising: Yes.    running, walking and yoga Smoker:  no  Health Maintenance: Pap: 11-08-15 Neg:Neg HR HPV, 11-27-12 Neg:Neg HR HPV, 02-04-11 Neg History of abnormal Pap:  no MMG: 03-31-19 Diag.Lt.w/US/3cm benign cyst in Lt.Br./Neg/Birads2 Colonoscopy: 2005 Neg;repeat age 47--Scheduled in June BMD:   n/a  Result  n/a TDaP:  PCP Gardasil:   no HIV:Neg years ago Hep C:Unsure Screening Labs:  PCP.    reports that she has never smoked. She has never used smokeless tobacco. She reports current alcohol use of about 2.0 standard drinks of alcohol per week. She reports that she does not use drugs.  Past Medical History:  Diagnosis Date  . Bradycardia   . Fibroids, subserous    1.4 cm.  . Varicose veins   . Vitiligo 2016    Past Surgical History:  Procedure Laterality Date  . GANGLION CYST EXCISION    . GANGLION CYST EXCISION Left   . WISDOM TOOTH EXTRACTION      Current Outpatient Medications  Medication Sig Dispense Refill  . Multiple Vitamin (MULTIVITAMIN) tablet Take 1 tablet by  mouth daily.     No current facility-administered medications for this visit.    Family History  Problem Relation Age of Onset  . Atrial fibrillation Mother 65  . Breast cancer Maternal Grandmother        late 50s    Review of Systems  All other systems reviewed and are negative.   Exam:   BP 100/66   Pulse 67   Ht 5' 6.5" (1.689 m)   Wt 135 lb (61.2 kg)   LMP 12/12/2020 (Exact Date)   SpO2 100%   BMI 21.46 kg/m     General appearance: alert, cooperative and appears stated age Head: normocephalic, without obvious abnormality, atraumatic Neck: no adenopathy, supple, symmetrical, trachea midline and thyroid normal to inspection and palpation Lungs: clear to auscultation bilaterally Breasts: normal appearance, no masses or tenderness, No nipple retraction or dimpling, No nipple discharge or bleeding, No axillary adenopathy Heart: regular rate and rhythm Abdomen: soft, non-tender; no masses, no organomegaly Extremities: extremities normal, atraumatic, no cyanosis or edema Skin: skin color, texture, turgor normal. No rashes or lesions Lymph nodes: cervical, supraclavicular, and axillary nodes normal. Neurologic: grossly normal  Pelvic: External genitalia:  no lesions              No abnormal inguinal nodes palpated.              Urethra:  normal appearing urethra with no masses,  tenderness or lesions              Bartholins and Skenes: normal                 Vagina: normal appearing vagina with normal color and discharge, no lesions              Cervix: no lesions              Pap taken: Yes.   Bimanual Exam:  Uterus:  1.5 cm right anterior irregularity of uterine surface.                Adnexa: no mass, fullness, tenderness              Rectal exam: Yes.  .  Confirms.              Anus:  normal sphincter tone, no lesions  Chaperone was present for exam.  Assessment:   Well woman visit with normal exam. Menorrhagia.  Hx small subserosal fibroid.   Plan: Mammogram  screening discussed. Self breast awareness reviewed. Pap and HR HPV as above. Guidelines for Calcium, Vitamin D, regular exercise program including cardiovascular and weight bearing exercise. Will do observational management of menorrhagia and fibroid for now.  Follow up annually and prn.

## 2020-12-19 NOTE — Patient Instructions (Signed)

## 2020-12-21 LAB — CYTOLOGY - PAP
Comment: NEGATIVE
Diagnosis: NEGATIVE
High risk HPV: NEGATIVE

## 2021-02-01 ENCOUNTER — Other Ambulatory Visit: Payer: Self-pay | Admitting: Obstetrics and Gynecology

## 2021-02-01 DIAGNOSIS — Z1231 Encounter for screening mammogram for malignant neoplasm of breast: Secondary | ICD-10-CM

## 2021-02-06 ENCOUNTER — Ambulatory Visit
Admission: RE | Admit: 2021-02-06 | Discharge: 2021-02-06 | Disposition: A | Discharge status: Home / Self Care | Payer: 59 | Source: Ambulatory Visit | Attending: Obstetrics and Gynecology | Admitting: Obstetrics and Gynecology

## 2021-02-06 ENCOUNTER — Other Ambulatory Visit: Payer: Self-pay

## 2021-02-06 DIAGNOSIS — Z1231 Encounter for screening mammogram for malignant neoplasm of breast: Secondary | ICD-10-CM

## 2021-03-27 LAB — COLOGUARD
COLOGUARD: NEGATIVE
Cologuard: NEGATIVE

## 2021-07-12 IMAGING — MG MM DIGITAL SCREENING BILAT W/ TOMO AND CAD
8 series · 9 of 24 positions shown · non-contrast
Comparison: Previous exam(s).

CLINICAL DATA: Screening.

EXAM:
DIGITAL SCREENING BILATERAL MAMMOGRAM WITH TOMOSYNTHESIS AND CAD
TECHNIQUE: Bilateral screening digital craniocaudal and mediolateral oblique
mammograms were obtained. Bilateral screening digital breast
tomosynthesis was performed. The images were evaluated with
computer-aided detection.

[L MLO synth-2D]
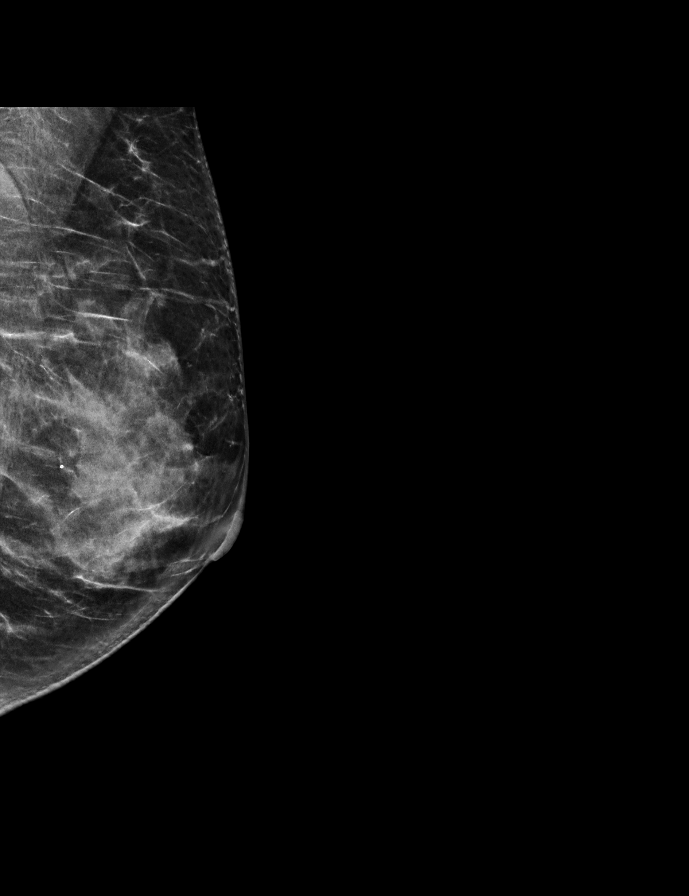

[L CC synth-2D]
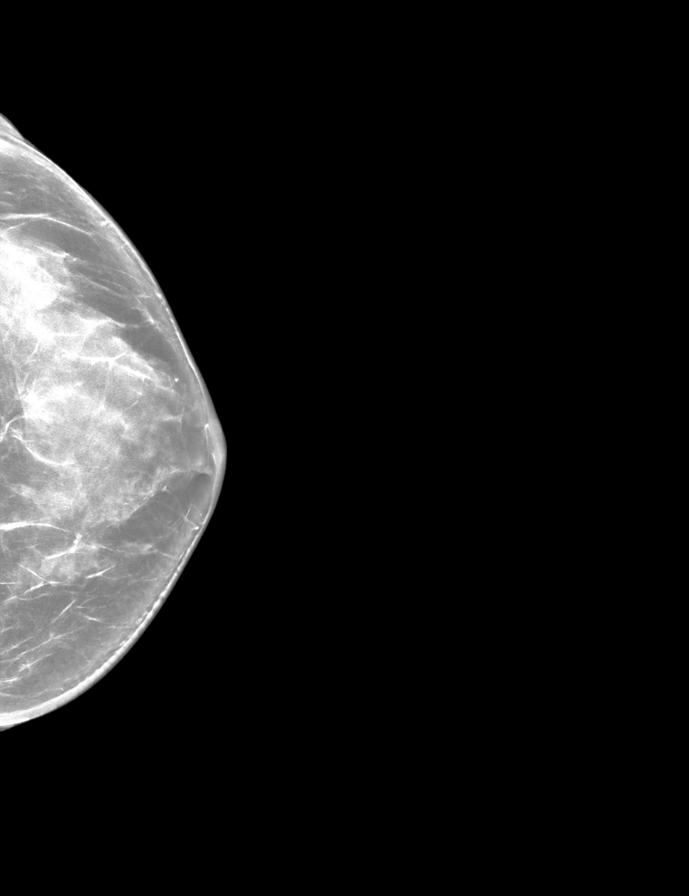

[R CC synth-2D]
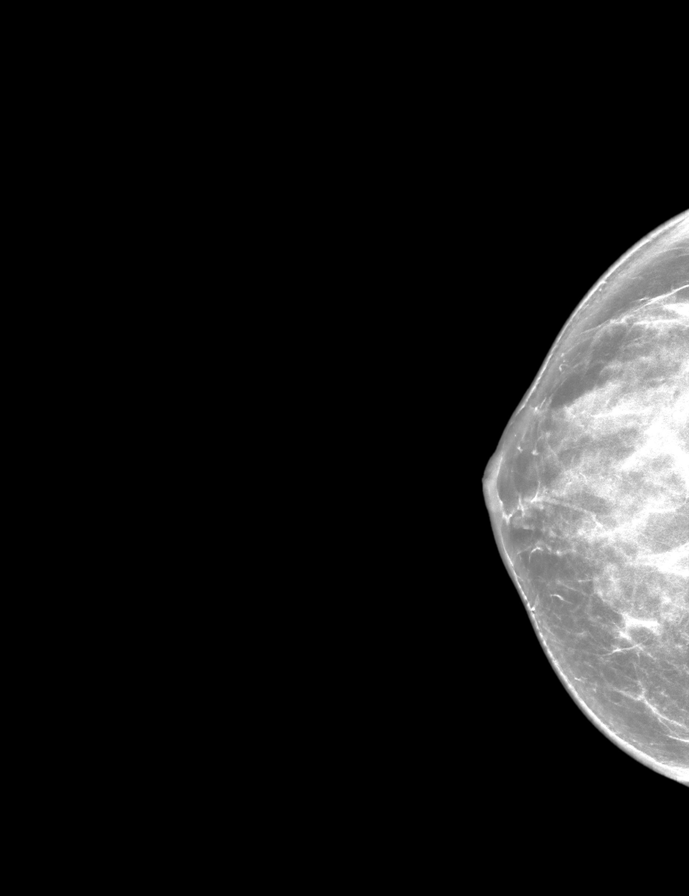

[R MLO synth-2D]
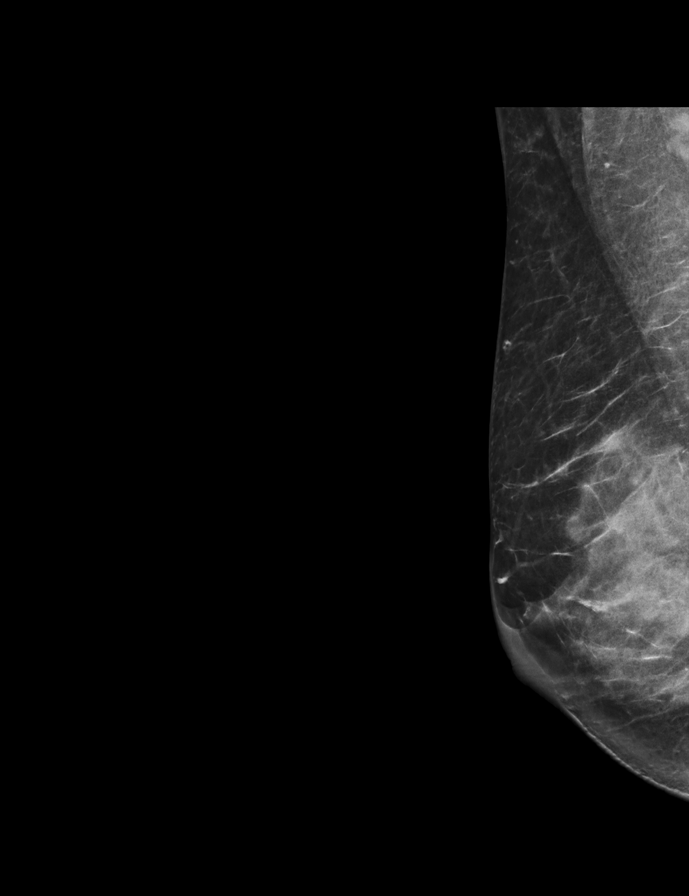

[L CC tomo · 2 of 65 frames shown]
[frame 21/65]
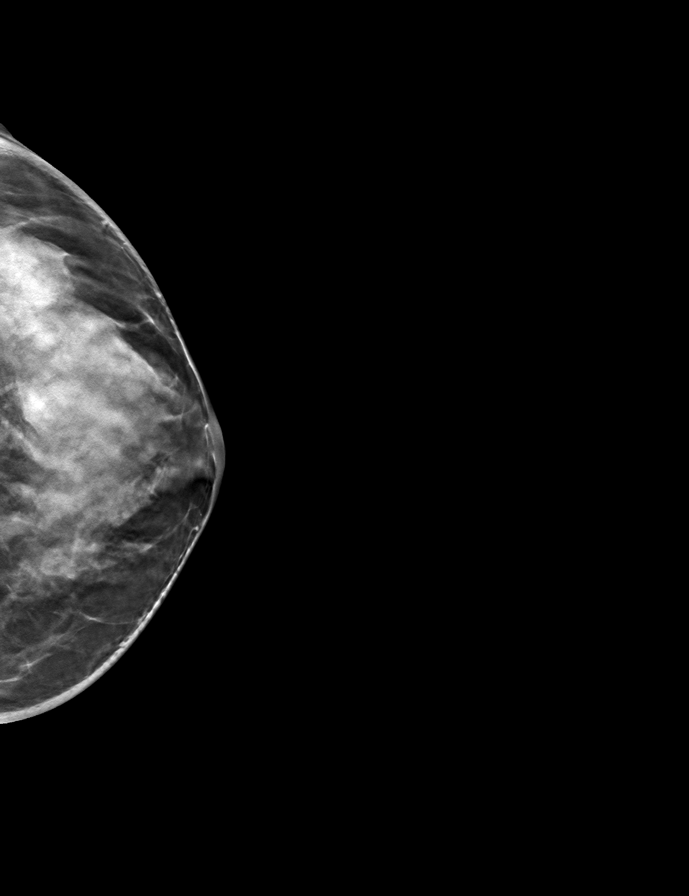
[frame 33/65]
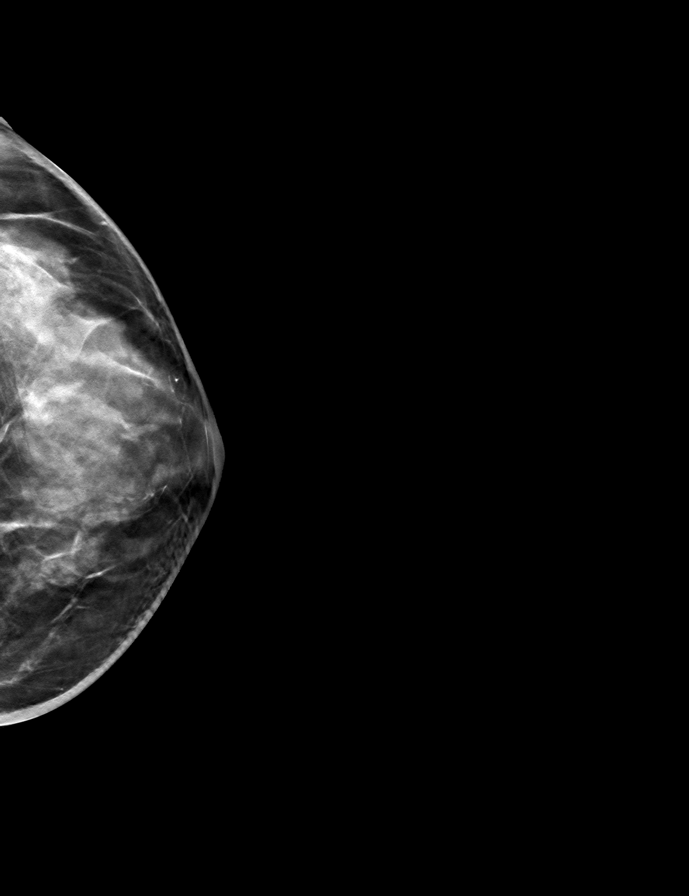

[L MLO tomo · tomo slice 31/62.0]
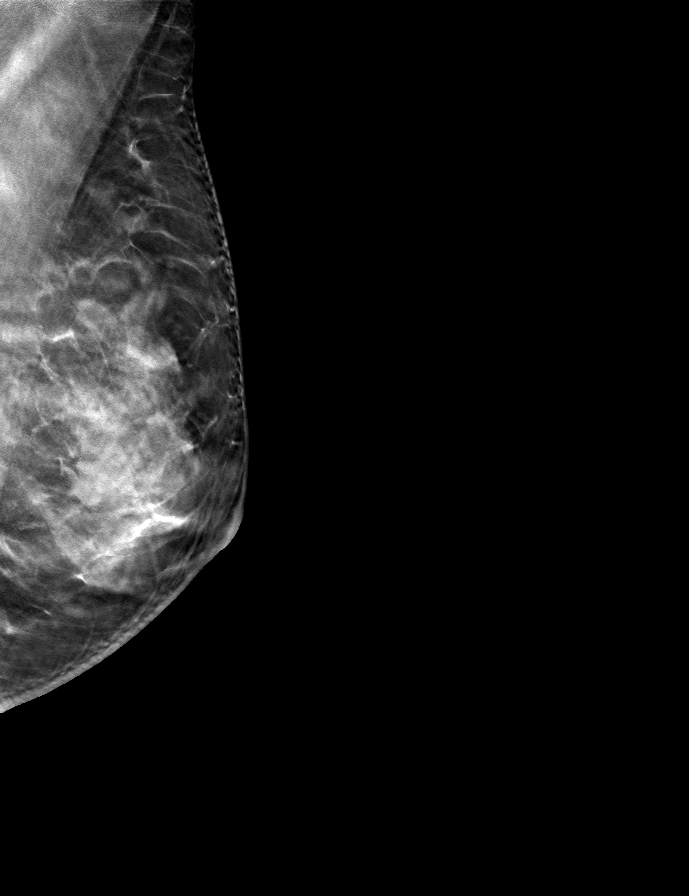

[R CC tomo · tomo slice 30/59.0]
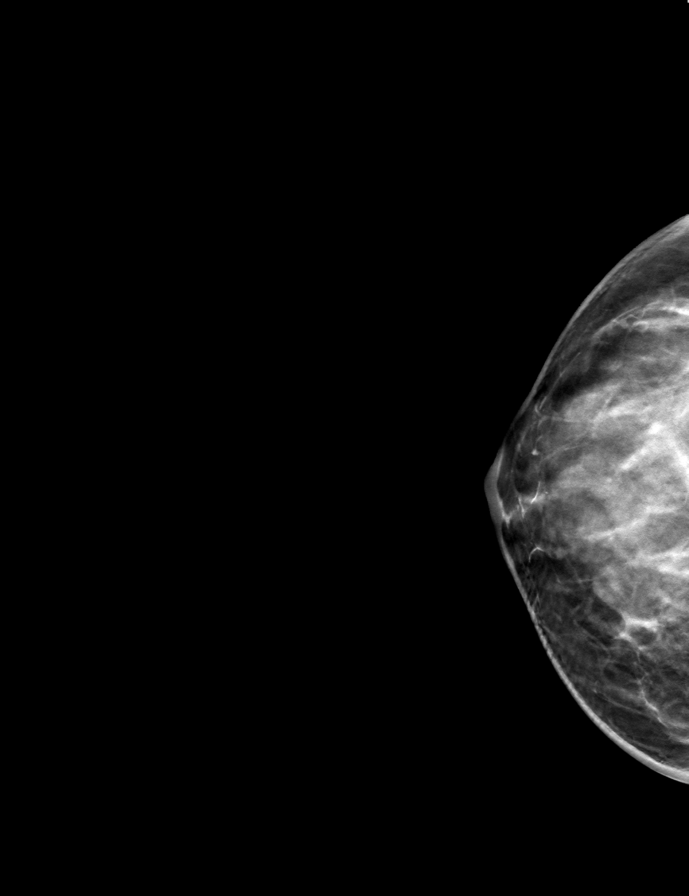

[R MLO tomo · tomo slice 35/68.0]
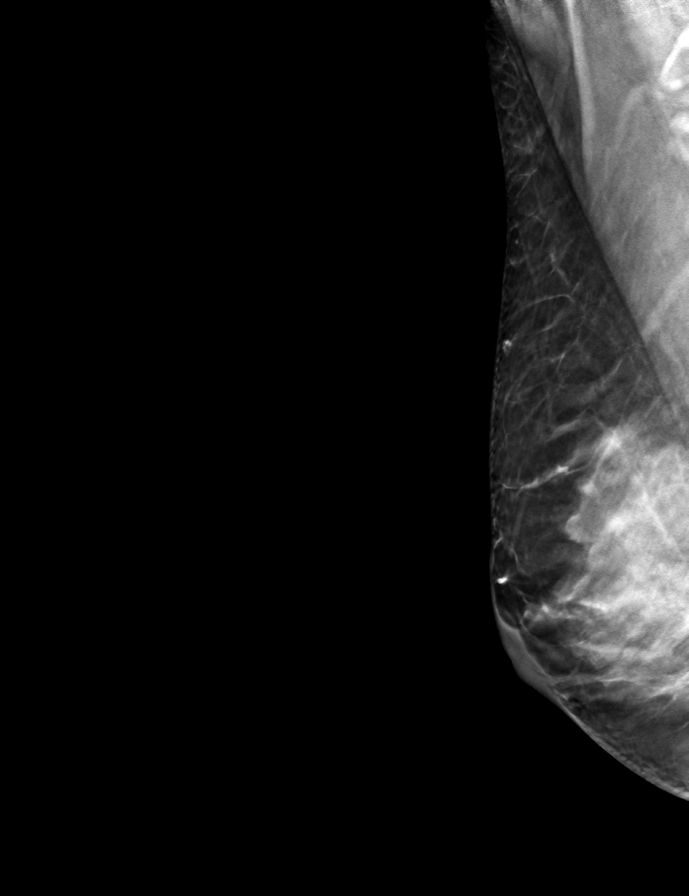

[9 of 24 positions shown; findings below may reference images not displayed]

ACR Breast Density Category c: The breast tissue is heterogeneously
dense, which may obscure small masses.
FINDINGS: There are no findings suspicious for malignancy.
IMPRESSION: No mammographic evidence of malignancy. A result letter of this
screening mammogram will be mailed directly to the patient.

RECOMMENDATION:
Screening mammogram in one year. (Code:Q3-W-BC3)

BI-RADS CATEGORY  1: Negative.

## 2021-10-09 NOTE — Progress Notes (Signed)
GYNECOLOGY  VISIT   HPI: 51 y.o.   Married  Caucasian  female   [phone] with Patient's last menstrual period was 06/19/2021 (exact date).   here for right breast mass x 2 months.  Feels round and came on suddenly. Painful.   Had a cyst on her left breast that resolved.  See dx imaging of the left breast 03/31/19 at [facility].   No recent Covid booster.   Some hot flashes, and they have resolved.   No breast trauma.   No hormonal treatment.   GYNECOLOGIC HISTORY: Patient's last menstrual period was 06/19/2021 (exact date). Contraception:  Vasectomy Menopausal hormone therapy:  none Last mammogram:  02-06-21 Neg/Birads1 Last pap smear:   12-19-20 Neg:Neg HR HPV, 11-08-15 Neg:Neg HR HPV, 11-27-12 Neg:Neg HR HPV        OB History     Gravida  3   Para  3   Term  3   Preterm      AB      Living  3      SAB      IAB      Ectopic      Multiple      Live Births                 Patient Active Problem List   Diagnosis Date Noted   Health care maintenance 10/25/2011   Bradycardia, sinus 10/25/2011    Past Medical History:  Diagnosis Date   Bradycardia    Fibroids, subserous    1.4 cm.   Varicose veins    Vitiligo 2016    Past Surgical History:  Procedure Laterality Date   GANGLION CYST EXCISION     GANGLION CYST EXCISION Left    WISDOM TOOTH EXTRACTION      Current Outpatient Medications  Medication Sig Dispense Refill   Multiple Vitamin (MULTIVITAMIN) tablet Take 1 tablet by mouth daily.     No current facility-administered medications for this visit.     ALLERGIES: Patient has no known allergies.  Family History  Problem Relation Age of Onset   Atrial fibrillation Mother 53   Breast cancer Maternal Grandmother        late 38s    Social History   Socioeconomic History   Marital status: Married    Spouse name: Not on file   Number of children: Not on file   Years of education: Not on file   Highest education level: Not on file   Occupational History   Not on file  Tobacco Use   Smoking status: Never   Smokeless tobacco: Never  Vaping Use   Vaping Use: Never used  Substance and Sexual Activity   Alcohol use: Yes    Alcohol/week: 2.0 standard drinks    Types: 2 Glasses of wine per week    Comment: 1-2 drinks per week   Drug use: No   Sexual activity: Yes    Partners: Male    Birth control/protection: Condom    Comment: vasectomy  Other Topics Concern   Not on file  Social History Narrative   Not on file   Social Determinants of Health   Financial Resource Strain: Not on file  Food Insecurity: Not on file  Transportation Needs: Not on file  Physical Activity: Not on file  Stress: Not on file  Social Connections: Not on file  Intimate Partner Violence: Not on file    Review of Systems  All other systems reviewed and are  negative.  PHYSICAL EXAMINATION:    BP 110/70    Pulse 72    Ht 5\' 6"  (1.676 m)    Wt 135 lb (61.2 kg)    LMP 06/19/2021 (Exact Date)    SpO2 99%    BMI 21.79 kg/m     General appearance: alert, cooperative and appears stated age   Breasts: left - normal appearance, no masses or tenderness, No nipple retraction or dimpling, No nipple discharge or bleeding, No axillary or supraclavicular adenopathy Right - normal appearance, n3 sm smooth and mobile mass at 10:00. No nipple retraction or dimpling, No nipple discharge or bleeding, No axillary or supraclavicular adenopathy  Chaperone was present for exam:  Yes.  ASSESSMENT  Right breast mass.  Hx breast cysts.   PLAN  Will proceed with dx right mammogram, right breast US, and possible drainage of right breast cyst at the [facility].   Fu for annual exam an prn.    An After Visit Summary was printed and given to the patient.

## 2021-10-10 ENCOUNTER — Ambulatory Visit: Payer: 59 | Admitting: Obstetrics and Gynecology

## 2021-10-10 ENCOUNTER — Other Ambulatory Visit: Payer: Self-pay

## 2021-10-10 ENCOUNTER — Telehealth: Payer: Self-pay | Admitting: Obstetrics and Gynecology

## 2021-10-10 ENCOUNTER — Encounter: Payer: Self-pay | Admitting: Obstetrics and Gynecology

## 2021-10-10 VITALS — BP 110/70 | HR 72 | Ht 66.0 in | Wt 135.0 lb

## 2021-10-10 DIAGNOSIS — N6311 Unspecified lump in the right breast, upper outer quadrant: Secondary | ICD-10-CM | POA: Diagnosis not present

## 2021-10-10 NOTE — Telephone Encounter (Signed)
Please schedule a dx right mammogram, left breast US and possible [facility] of [facility].   Patient has a persistent right breast mass for 2 months and a history of breast cysts.

## 2021-10-11 NOTE — Telephone Encounter (Signed)
Orders placed at breast center for right breast imaging. I was told to add the possible drainage of right breast cyst in the comment section for radiologist to determine if this needed.   Patient scheduled on 11/01/21 @ 9:10 am left message for patient to call.

## 2021-10-11 NOTE — Telephone Encounter (Signed)
Per office note 10/10/21 "Will proceed with dx right mammogram, right breast US, and possible drainage of right breast cyst at [facility]. "

## 2021-10-16 NOTE — Telephone Encounter (Signed)
Left message for patient to call.

## 2021-10-24 NOTE — Telephone Encounter (Signed)
Spoke with patient and informed her of date and time of scheduled breast imaging. ?

## 2021-11-01 ENCOUNTER — Other Ambulatory Visit: Payer: 59

## 2021-11-05 ENCOUNTER — Other Ambulatory Visit: Payer: Self-pay

## 2021-11-05 ENCOUNTER — Telehealth: Payer: Self-pay

## 2021-11-05 ENCOUNTER — Encounter: Payer: Self-pay | Admitting: Obstetrics and Gynecology

## 2021-11-05 ENCOUNTER — Ambulatory Visit: Payer: 59 | Admitting: Obstetrics and Gynecology

## 2021-11-05 VITALS — BP 122/70 | HR 57 | Ht 66.0 in | Wt 135.0 lb

## 2021-11-05 DIAGNOSIS — N921 Excessive and frequent menstruation with irregular cycle: Secondary | ICD-10-CM

## 2021-11-05 NOTE — Progress Notes (Signed)
GYNECOLOGY  VISIT ?  ?HPI: ?51 y.o.   Married  Caucasian  female   ?[id number] with Patient's last menstrual period was 11/02/2021.   ?here for heavy vaginal bleeding. Patient states she does feel a weak. She started some iron yesterday. ? ?Had regular periods until November.  ? ?Then no cycle until 11/02/21.  ?Having heavy bleeding, which seems to be decreasing.  ?Tampon change every hour for 2.5 days.  ?Clotting.  ?Woke up in a pool of blood for the last 3 nights.  ?Hx small subserous fibroid.  ? ?She usually does have heavy cycles, but the heaviness is lasting longer with this cycle. ? ?Taking a lot of ibuprofen for cramping.  ? ?Feels exhausted.  ?Feels out of sorts.  ?No SOB.  ?No HA.  ? ?Started an iron supplement.  ?No heart racing.  ? ?Denies smoking, migraine with aura, HTN, breast or liver disease, or personal or family hx of thromboembolic events. ? ?GYNECOLOGIC HISTORY: ?Patient's last menstrual period was 11/02/2021. ?Contraception:  Vasectomy ?Menopausal hormone therapy:  none ?Last mammogram:   02-06-21 Neg/Birads1 ?Last pap smear:    12-19-20 Neg:Neg HR HPV, 11-08-15 Neg:Neg HR HPV, 11-27-12 Neg:Neg HR HPV ?       ?OB History   ? ? Gravida  ?3  ? Para  ?3  ? Term  ?3  ? Preterm  ?   ? AB  ?   ? Living  ?3  ?  ? ? SAB  ?   ? IAB  ?   ? Ectopic  ?   ? Multiple  ?   ? Live Births  ?   ?   ?  ?  ?    ? ?Patient Active Problem List  ? Diagnosis Date Noted  ? Health care maintenance 10/25/2011  ? Bradycardia, sinus 10/25/2011  ? ? ?Past Medical History:  ?Diagnosis Date  ? Bradycardia   ? Fibroids, subserous   ? 1.4 cm.  ? Varicose veins   ? Vitiligo 2016  ? ? ?Past Surgical History:  ?Procedure Laterality Date  ? GANGLION CYST EXCISION    ? GANGLION CYST EXCISION Left   ? WISDOM TOOTH EXTRACTION    ? ? ?Current Outpatient Medications  ?Medication Sig Dispense Refill  ? ferrous sulfate 325 (65 FE) MG EC tablet Take 325 mg by mouth daily.    ? Multiple Vitamin (MULTIVITAMIN) tablet Take 1 tablet by mouth daily.     ? ?No current facility-administered medications for this visit.  ?  ? ?ALLERGIES: Patient has no known allergies. ? ?Family History  ?Problem Relation Age of Onset  ? Atrial fibrillation Mother 63  ? Breast cancer Maternal Grandmother   ?     late 34s  ? ? ?Social History  ? ?Socioeconomic History  ? Marital status: Married  ?  Spouse name: Not on file  ? Number of children: Not on file  ? Years of education: Not on file  ? Highest education level: Not on file  ?Occupational History  ? Not on file  ?Tobacco Use  ? Smoking status: Never  ? Smokeless tobacco: Never  ?Vaping Use  ? Vaping Use: Never used  ?Substance and Sexual Activity  ? Alcohol use: Yes  ?  Alcohol/week: 2.0 standard drinks  ?  Types: 2 Glasses of wine per week  ?  Comment: 1-2 drinks per week  ? Drug use: No  ? Sexual activity: Yes  ?  Partners: Male  ?  Birth  control/protection: Condom  ?  Comment: vasectomy  ?Other Topics Concern  ? Not on file  ?Social History Narrative  ? Not on file  ? ?Social Determinants of Health  ? ?Financial Resource Strain: Not on file  ?Food Insecurity: Not on file  ?Transportation Needs: Not on file  ?Physical Activity: Not on file  ?Stress: Not on file  ?Social Connections: Not on file  ?Intimate Partner Violence: Not on file  ? ? ?Review of Systems  ?Genitourinary:  Positive for menstrual problem (heavy bleeding x4 days).  ?All other systems reviewed and are negative. ? ?PHYSICAL EXAMINATION:   ? ?BP 122/70   Pulse (!) 57   Ht '5\' 6"'$  (1.676 m)   Wt 135 lb (61.2 kg)   LMP 11/02/2021   SpO2 100%   BMI 21.79 kg/m?     ?General appearance: alert, cooperative and appears stated age ?Head: Normocephalic, without obvious abnormality, atraumatic ?Lungs: clear to auscultation bilaterally ?Heart: regular rate and rhythm ?Abdomen: soft, non-tender, no masses,  no organomegaly ?No abnormal inguinal nodes palpated ? ?Pelvic: External genitalia:  no lesions ?             Urethra:  normal appearing urethra with no masses,  tenderness or lesions ?             Bartholins and Skenes: normal    ?             Vagina: normal appearing vagina with normal color and discharge, no lesions ?             Cervix: no lesions.  Clot noted at top of vagina, ?               ?Bimanual Exam:  Uterus:  normal size, contour, position, consistency, mobility, non-tender fundal fibroid noted.  ?             Adnexa: no mass, fullness, tenderness ?             Rectal exam: yes.  Confirms. ?             Anus:  normal sphincter tone, no lesions ? ?Chaperone was present for exam:  Estill Bamberg, CMA ? ?ASSESSMENT ? ?Menorrhagia with irregular cycle ? ?PLAN ? ?CBC, iron, ferritin.  ?We discussed LoLoestrin versus Aygestin daily.  ?She wants to consider her options.  ?She will call if her bleeding does not improve further or if she feels SOB, HA, tachycardia.  ?  ?An After Visit Summary was printed and given to the patient. ? ?27 min  total time was spent for this patient encounter, including preparation, face-to-face counseling with the patient, coordination of care, and documentation of the encounter. ? ? ?

## 2021-11-05 NOTE — Telephone Encounter (Signed)
Patient has not had a period in 4 months.  She started bleeding Friday and said it has been extremely heavy since started.  She has been soaking through Ultra Tampon and pad every one and a half hours since she started.  She started taking some iron today.  ?

## 2021-11-05 NOTE — Patient Instructions (Addendum)
Hormonal Contraception Information ?I would consider the pill called LoLoestrin.  ?Hormonal contraception is a type of birth control that uses hormones to prevent pregnancy. It usually involves a combination of the hormones estrogen and progesterone, or only the hormone progesterone. Hormonal contraception works in these ways: ?It thickens the mucus in the cervix, which is the lowest part of the uterus. Thicker mucus makes it harder for sperm to enter the uterus. ?It changes the lining of the uterus. This makes it harder for an egg to attach or implant. ?It may stop the ovaries from releasing eggs (ovulation). Some women who take hormonal contraceptives that contain only progesterone may continue to ovulate. ?Hormonal contraception cannot prevent STIs (sexually transmitted infections). Pregnancy may still occur. ?Types of hormonal contraception ?Estrogen and progesterone contraceptives ?Contraceptives that use a combination of estrogen and progesterone are available in these forms: ?Pill. Pills come in different combinations of hormones. Pills must be taken at the same time each day. They can affect your period. You can get your period monthly, once every 3 months, or not at all. ?Patch. The patch is applied to the buttocks, abdomen, upper outer arm, or back. It is kept in place for 3 weeks. It is removed for the last or fourth week of the cycle. ?Vaginal ring. The ring is placed in the vagina and left there for 3 weeks. It is then removed for the last or fourth week of the cycle. ?Progesterone-only contraceptives ?Contraceptives that use only progesterone are available in these forms: ?Pill. Pills should be taken at the same time everyday. This is very important to decrease the chance of pregnancy. Pills containing progestin-only are usually taken every day of the cycle. Other types of pills may have a placebo tablet for the last 4 days of every cycle. ?Intrauterine device (IUD). This device is inserted through the  vagina and cervix into the uterus. It is removed or replaced every 3 to 5 years, depending on the type. It can be removed sooner. ?Implant. Plastic rods are placed under the skin of the upper arm. They are removed or replaced every 3 years. They can be removed sooner.Norethindrone acetate (hormone replacement) ?What is this medication? ?NORETHINDRONE ACETATE (nor eth IN drone  AS e tate) is a female hormone. This medicine is used to treat endometriosis, uterine bleeding caused by abnormal hormone levels, and secondary amenorrhea. Secondary amenorrhea is when a woman stops getting menstrual periods due to low levels of certain female hormones. ?This medicine may be used for other purposes; ask your health care provider or pharmacist if you have questions. ?COMMON BRAND NAME(S): Aygestin ?What should I tell my care team before I take this medication? ?They need to know if you have any of these conditions: ?blood vessel disease or blood clots ?breast, cervical, or vaginal cancer ?diabetes ?heart disease ?kidney disease ?liver disease ?mental depression ?migraine ?seizures ?stroke ?vaginal bleeding ?an unusual or allergic reaction to norethindrone, other medicines, foods, dyes, or preservatives ?pregnant or trying to get pregnant ?breast-feeding ?How should I use this medication? ?Take this medicine by mouth with a glass of water. You may take this medicine with or without food. Follow the directions on the prescription label. Take this medicine at the same time each day. Do not take your medicine more often than directed. ?A patient information sheet will be given with each prescription and refill. Read this sheet carefully each time. The sheet may change frequently. ?Talk to your pediatrician regarding the use of this medicine in children. Special  care may be needed. ?Overdosage: If you think you have taken too much of this medicine contact a poison control center or emergency room at once. ?NOTE: This medicine is  only for you. Do not share this medicine with others. ?What if I miss a dose? ?If you miss a dose, take it as soon as you can. If it is almost time for your next dose, take only that dose. Do not take double or extra doses. ?What may interact with this medication? ?Do not take this medicine with any of the following medications: ?amprenavir or fosamprenavir ?bosentan ?This medicine may also interact with the following medications: ?antibiotics or medicines for infections, especially rifampin, rifabutin, rifapentine, and griseofulvin, and possibly penicillins or tetracyclines ?aprepitant ?barbiturate medicines, such as phenobarbital ?carbamazepine ?felbamate ?modafinil ?oxcarbazepine ?phenytoin ?ritonavir or other medicines for HIV infection or AIDS ?St. John's wort ?topiramate ?This list may not describe all possible interactions. Give your health care provider a list of all the medicines, herbs, non-prescription drugs, or dietary supplements you use. Also tell them if you smoke, drink alcohol, or use illegal drugs. Some items may interact with your medicine. ?What should I watch for while using this medication? ?Visit your care team for regular checks on your progress. You will need a regular breast and pelvic exam and Pap smear while on this medication. ?If you have any reason to think you are pregnant, stop taking this medication right away and contact your care team. ?If you are taking this medication for hormone-related problems, it may take several cycles of use to see improvement in your condition. ?What side effects may I notice from receiving this medication? ?Side effects that you should report to your doctor or health care professional as soon as possible: ?breast tenderness or discharge ?pain in the abdomen, chest, groin or leg ?severe headache ?skin rash, itching, or hives ?sudden shortness of breath ?unusually weak or tired ?vision or speech problems ?yellowing of skin or eyes ?Side effects that  usually do not require medical attention (report to your doctor or health care professional if they continue or are bothersome): ?changes in sexual desire ?change in menstrual flow ?facial hair growth ?fluid retention and swelling ?headache ?irritability ?nausea ?weight gain or loss ?This list may not describe all possible side effects. Call your doctor for medical advice about side effects. You may report side effects to FDA at 1-800-FDA-1088. ?Where should I keep my medication? ?Keep out of the reach of children. ?Store at room temperature between 15 and 30 degrees C (59 and 86 degrees F). Throw away any unused medicine after the expiration date. ?NOTE: This sheet is a summary. It may not cover all possible information. If you have questions about this medicine, talk to your doctor, pharmacist, or health care provider. ?? 2022 Elsevier/Gold Standard (2021-05-09 00:00:00) ? ?Shot (injection). The injection is given once every 12 or 13 weeks (about 3 months). ?Risks associated with hormonal contraception ?Estrogen and progesterone contraceptives can sometimes cause side effects, such as: ?Nausea. ?Headaches. ?Breast tenderness. ?Bleeding or spotting between menstrual cycles. ?High blood pressure (rare). ?Strokes, heart attacks, or blood clots (rare). ?Progesterone-only contraceptives also can have side effects, such as: ?Nausea. ?Headaches. ?Breast tenderness. ?Irregular menstrual bleeding. ?High blood pressure (rare). ?Talk to your health care provider about what side effects may mean for you. ?Questions to ask: ?What type of hormonal contraception is right for me? ?How long should I plan to use hormonal contraception? ?What are the side effects of the hormonal contraception method I  choose? ?How can I prevent STIs while using hormonal contraception? ?Where to find more information ?Ask your health care provider for more information and resources about hormonal contraception. You can also go to: ?[place] Department of  Health and [employer], Office on Women's Health: [place] ?Summary ?Estrogen and progesterone are hormones used in many forms of birth control. ?Hormonal contraception cannot prevent STIs (se

## 2021-11-05 NOTE — Telephone Encounter (Signed)
Patient advised to check in at 2pm today for visit with Dr. Quincy Simmonds at 2:15pm. ?

## 2021-11-06 LAB — CBC
HCT: 38.3 % (ref 35.0–45.0)
Hemoglobin: 12.3 g/dL (ref 11.7–15.5)
MCH: 27.3 pg (ref 27.0–33.0)
MCHC: 32.1 g/dL (ref 32.0–36.0)
MCV: 84.9 fL (ref 80.0–100.0)
MPV: 10.8 fL (ref 7.5–12.5)
Platelets: 222 10*3/uL (ref 140–400)
RBC: 4.51 10*6/uL (ref 3.80–5.10)
RDW: 12.9 % (ref 11.0–15.0)
WBC: 5.1 10*3/uL (ref 3.8–10.8)

## 2021-11-06 LAB — FERRITIN: Ferritin: 20 ng/mL (ref 16–232)

## 2021-11-06 LAB — IRON: Iron: 41 ug/dL — ABNORMAL LOW (ref 45–160)

## 2021-11-16 ENCOUNTER — Ambulatory Visit
Admission: RE | Admit: 2021-11-16 | Discharge: 2021-11-16 | Disposition: A | Discharge status: Home / Self Care | Payer: 59 | Source: Ambulatory Visit | Attending: Obstetrics and Gynecology | Admitting: Obstetrics and Gynecology

## 2021-11-16 DIAGNOSIS — N6311 Unspecified lump in the right breast, upper outer quadrant: Secondary | ICD-10-CM

## 2022-02-26 ENCOUNTER — Ambulatory Visit: Payer: 59 | Admitting: Obstetrics and Gynecology

## 2022-02-26 ENCOUNTER — Encounter: Payer: Self-pay | Admitting: Obstetrics and Gynecology

## 2022-02-26 VITALS — BP 132/70 | Ht 66.0 in | Wt 135.0 lb

## 2022-02-26 DIAGNOSIS — D259 Leiomyoma of uterus, unspecified: Secondary | ICD-10-CM

## 2022-02-26 DIAGNOSIS — N921 Excessive and frequent menstruation with irregular cycle: Secondary | ICD-10-CM

## 2022-02-26 LAB — PREGNANCY, URINE: Preg Test, Ur: NEGATIVE

## 2022-02-26 MED ORDER — LO LOESTRIN FE 1 MG-10 MCG / 10 MCG PO TABS: 1.0000 | ORAL_TABLET | Freq: Every day | ORAL | 0 refills | Status: DC

## 2022-02-26 NOTE — Progress Notes (Signed)
GYNECOLOGY  VISIT   HPI: 51 y.o.   Married  Caucasian  female   [id number] with Patient's last menstrual period was 02/19/2022 (exact date).   here for heavy cycles. Patient skipped cycles in April and May. Has been bleeding heavily x1 week. Some better today.  Has had recurrent heavy bleeding with skipped cycles in between.  Requiring tampon change every hour.  Low back ache.  Feels a lot of circulatory pressure with her cycle.  Hx 1.4 cm subserous fibroid on [place] 03/28/16.  EMB showed benign proliferative endometrium.   Fatigue at the end of her cycle.  Taking iron supplements.   No prior use of COCs.   Denies smoking, migraine with aura, HTN, breast or liver disease, or personal or family hx of thromboembolic events.  GYNECOLOGIC HISTORY: Patient's last menstrual period was 02/19/2022 (exact date). Contraception:  Vasectomy Menopausal hormone therapy:  none Last mammogram:  11-16-21 Diag.Rt/benign cyst 10 o'clock axis/Birads2/screening 1 yr Last pap smear:   12-19-20 Neg:Neg HR HPV, 11-08-15 Neg:Neg HR HPV, 11-27-12 Neg:Neg HR HPV        OB History     Gravida  3   Para  3   Term  3   Preterm      AB      Living  3      SAB      IAB      Ectopic      Multiple      Live Births                 Patient Active Problem List   Diagnosis Date Noted   Health care maintenance 10/25/2011   Bradycardia, sinus 10/25/2011    Past Medical History:  Diagnosis Date   Bradycardia    Fibroids, subserous    1.4 cm.   Varicose veins    Vitiligo 2016    Past Surgical History:  Procedure Laterality Date   GANGLION CYST EXCISION     GANGLION CYST EXCISION Left    WISDOM TOOTH EXTRACTION      Current Outpatient Medications  Medication Sig Dispense Refill   ferrous sulfate 325 (65 FE) MG EC tablet Take 325 mg by mouth daily.     Multiple Vitamin (MULTIVITAMIN) tablet Take 1 tablet by mouth daily.     No current facility-administered medications for this visit.      ALLERGIES: Patient has no known allergies.  Family History  Problem Relation Age of Onset   Atrial fibrillation Mother 61   Breast cancer Maternal Grandmother        late 40s    Social History   Socioeconomic History   Marital status: Married    Spouse name: Not on file   Number of children: Not on file   Years of education: Not on file   Highest education level: Not on file  Occupational History   Not on file  Tobacco Use   Smoking status: Never   Smokeless tobacco: Never  Vaping Use   Vaping Use: Never used  Substance and Sexual Activity   Alcohol use: Yes    Alcohol/week: 2.0 standard drinks of alcohol    Types: 2 Glasses of wine per week    Comment: 1-2 drinks per week   Drug use: No   Sexual activity: Yes    Partners: Male    Birth control/protection: Condom    Comment: vasectomy  Other Topics Concern   Not on file  Social History Narrative  Not on file   Social Determinants of Health   Financial Resource Strain: Not on file  Food Insecurity: Not on file  Transportation Needs: Not on file  Physical Activity: Not on file  Stress: Not on file  Social Connections: Not on file  Intimate Partner Violence: Not on file    Review of Systems  Constitutional:  Positive for fatigue.  Genitourinary:  Positive for menstrual problem (heavy menses).  All other systems reviewed and are negative.   PHYSICAL EXAMINATION:    BP 132/70   Ht '5\' 6"'$  (1.676 m)   Wt 135 lb (61.2 kg)   LMP 02/19/2022 (Exact Date)   BMI 21.79 kg/m     General appearance: alert, cooperative and appears stated age   Pelvic: External genitalia:  no lesions              Urethra:  normal appearing urethra with no masses, tenderness or lesions              Bartholins and Skenes: normal                 Vagina: normal appearing vagina with normal color and discharge, no lesions              Cervix: no lesions.  Small amount of blood in the vagina.                Bimanual Exam:  Uterus:   normal size, contour, position, consistency, mobility, non-tender              Adnexa: no mass, fullness, tenderness      Chaperone was present for exam:  Estill Bamberg, CMA  ASSESSMENT  Menorrhagia with irregular menses.  Uterine fibroid.   PLAN  UPT negative.  Start LoLoestrin FE. No contraindications. Instructed in use.  Wide effects discussed.  Risk of stroke, DVT, PE, and MI reviewed along with warning signs.  Return for pelvic US and recheck.  Annual exam in September.     An After Visit Summary was printed and given to the patient.  25 min  total time was spent for this patient encounter, including preparation, face-to-face counseling with the patient, coordination of care, and documentation of the encounter.

## 2022-02-27 ENCOUNTER — Other Ambulatory Visit: Payer: Self-pay

## 2022-02-27 DIAGNOSIS — N921 Excessive and frequent menstruation with irregular cycle: Secondary | ICD-10-CM

## 2022-02-27 DIAGNOSIS — D259 Leiomyoma of uterus, unspecified: Secondary | ICD-10-CM

## 2022-03-01 ENCOUNTER — Telehealth: Payer: Self-pay | Admitting: *Deleted

## 2022-03-01 ENCOUNTER — Encounter: Payer: Self-pay | Admitting: Obstetrics and Gynecology

## 2022-03-01 NOTE — Telephone Encounter (Signed)
Left detailed message on patient voicemail per DPR access.  

## 2022-03-01 NOTE — Telephone Encounter (Signed)
She can start the pills immediately.  We are not concerned about pregnancy for her, so she does not need to be on her menstrual cycle to start the pills.

## 2022-03-01 NOTE — Telephone Encounter (Signed)
Patient was seen 02/27/22 and prescribed lo Loestrin 1/10 mcg tablet, she can't remember if you told her to start pill now? She is reading the directions which state start on cycle, patient cycle has ended. Please advise

## 2022-03-13 ENCOUNTER — Encounter: Payer: Self-pay | Admitting: Obstetrics and Gynecology

## 2022-03-13 ENCOUNTER — Ambulatory Visit: Payer: 59 | Admitting: Obstetrics and Gynecology

## 2022-03-14 NOTE — Telephone Encounter (Signed)
Ultrasound scheduled on 03/28/22

## 2022-03-26 ENCOUNTER — Telehealth: Payer: Self-pay

## 2022-03-26 NOTE — Telephone Encounter (Signed)
Patient is scheduled for u/s on Thursday and said Dr. Quincy Simmonds told her she should not be bleeding when it is performed. She said she has been bleeding x 2 weeks and keeps thinking it is stopping but it has not. She is concerned she might still be bleeding for appointment and questions what Dr. Quincy Simmonds recommends.

## 2022-03-26 NOTE — Telephone Encounter (Signed)
I recommend patient take active birth control pills twice a day for 5 days and then take one pill by mouth daily.   Have her keep her appointment for the ultrasound and follow up with me this week.

## 2022-03-26 NOTE — Telephone Encounter (Signed)
Spoke with patient and informed her of Dr. Elza Rafter recommendations.

## 2022-03-27 NOTE — Progress Notes (Unsigned)
GYNECOLOGY  VISIT   HPI: 51 y.o.   Married  Caucasian  female   [id number] with Patient's last menstrual period was 02/19/2022 (exact date).   here for pelvic ultrasound.    Perimenopausal and skipped cycles in April and May this year.  Seen 02/26/22 with heavy bleeding and pelvic pressure.   Known 1.4 cm subserous fibroid on prior US in 2017.  Her EMB then was benign proliferative.     She started on LoLoEstrin Fe after her recent visit.   Period started 2 weeks ago and flow has been more normal.  She has doubled up on her birth control pills and she is not bleeding anymore.  Hardly any cramping now.   Hgb 11.9 on 03/12/22. Just did a recheck of her blood counts.   Has a short HA at the end of her period. Motrin treats this.   School restarts soon.   GYNECOLOGIC HISTORY: Patient's last menstrual period was 02/19/2022 (exact date). Contraception:  Vasectomy/LoLoestrin Menopausal hormone therapy:  none Last mammogram:   11-16-21 Diag.Rt/benign cyst 10 o'clock axis/Birads2/screening 1 yr Last pap smear:  12-19-20 Neg:Neg HR HPV, 11-08-15 Neg:Neg HR HPV, 11-27-12 Neg:Neg HR HPV        OB History     Gravida  3   Para  3   Term  3   Preterm      AB      Living  3      SAB      IAB      Ectopic      Multiple      Live Births                 Patient Active Problem List   Diagnosis Date Noted   Health care maintenance 10/25/2011   Bradycardia, sinus 10/25/2011    Past Medical History:  Diagnosis Date   Bradycardia    Fibroids, subserous    1.4 cm.   Varicose veins    Vitiligo 2016    Past Surgical History:  Procedure Laterality Date   GANGLION CYST EXCISION     GANGLION CYST EXCISION Left    WISDOM TOOTH EXTRACTION      Current Outpatient Medications  Medication Sig Dispense Refill   ferrous sulfate 325 (65 FE) MG EC tablet Take 325 mg by mouth daily.     Multiple Vitamin (MULTIVITAMIN) tablet Take 1 tablet by mouth daily.      Norethindrone-Ethinyl Estradiol-Fe Biphas (LO LOESTRIN FE) 1 MG-10 MCG / 10 MCG tablet Take 1 tablet by mouth daily. 84 tablet 0   No current facility-administered medications for this visit.     ALLERGIES: Patient has no known allergies.  Family History  Problem Relation Age of Onset   Atrial fibrillation Mother 88   Breast cancer Maternal Grandmother        late 35s    Social History   Socioeconomic History   Marital status: Married    Spouse name: Not on file   Number of children: Not on file   Years of education: Not on file   Highest education level: Not on file  Occupational History   Not on file  Tobacco Use   Smoking status: Never   Smokeless tobacco: Never  Vaping Use   Vaping Use: Never used  Substance and Sexual Activity   Alcohol use: Yes    Alcohol/week: 2.0 standard drinks of alcohol    Types: 2 Glasses of wine per week  Comment: 1-2 drinks per week   Drug use: No   Sexual activity: Yes    Partners: Male    Birth control/protection: Condom    Comment: vasectomy  Other Topics Concern   Not on file  Social History Narrative   Not on file   Social Determinants of Health   Financial Resource Strain: Not on file  Food Insecurity: Not on file  Transportation Needs: Not on file  Physical Activity: Not on file  Stress: Not on file  Social Connections: Not on file  Intimate Partner Violence: Not on file    Review of Systems  All other systems reviewed and are negative.   PHYSICAL EXAMINATION:    BP 118/70   Ht '5\' 6"'$  (2.841 m)   Wt 135 lb (61.2 kg)   LMP 02/19/2022 (Exact Date)   BMI 21.79 kg/m     General appearance: alert, cooperative and appears stated age  Pelvic US Uterus 9.84 x 7.41 x 5.61 cm.  Myometrium inhomogeneous suggesting of adenomyosis.  2 fibroids 1.89 cm and 0.85 cm.  EMS 3.66 mm.   Symmetrical.  Small fluid in canal.  No masses noted.  Left ovary 2.57 x 1.59 x 1.90 cm.  Right ovary 2.50 x 1.36 x 1.65 cm.  No adnexal  masses.  No free fluid.  ASSESSMENT  Menorrhagia with irregular menses.  Fibroids.  Possible adenomyosis.  OCP surveillance.   PLAN  [place] images and report reviewed.  Switch to Loestrin 24.  Follow up for annual exam in September and prn.    An After Visit Summary was printed and given to the patient.  29 min  total time was spent for this patient encounter, including preparation, face-to-face counseling with the patient, coordination of care, and documentation of the encounter.

## 2022-03-28 ENCOUNTER — Encounter: Payer: Self-pay | Admitting: Obstetrics and Gynecology

## 2022-03-28 ENCOUNTER — Ambulatory Visit (INDEPENDENT_AMBULATORY_CARE_PROVIDER_SITE_OTHER): Payer: 59

## 2022-03-28 ENCOUNTER — Other Ambulatory Visit: Payer: 59 | Admitting: Obstetrics and Gynecology

## 2022-03-28 ENCOUNTER — Other Ambulatory Visit: Payer: 59

## 2022-03-28 ENCOUNTER — Ambulatory Visit: Payer: 59 | Admitting: Obstetrics and Gynecology

## 2022-03-28 VITALS — BP 118/70 | Ht 66.0 in | Wt 135.0 lb

## 2022-03-28 DIAGNOSIS — D259 Leiomyoma of uterus, unspecified: Secondary | ICD-10-CM

## 2022-03-28 DIAGNOSIS — Z3041 Encounter for surveillance of contraceptive pills: Secondary | ICD-10-CM | POA: Diagnosis not present

## 2022-03-28 DIAGNOSIS — N921 Excessive and frequent menstruation with irregular cycle: Secondary | ICD-10-CM | POA: Diagnosis not present

## 2022-03-28 DIAGNOSIS — D219 Benign neoplasm of connective and other soft tissue, unspecified: Secondary | ICD-10-CM

## 2022-03-28 MED ORDER — NORETHIN ACE-ETH ESTRAD-FE 1-20 MG-MCG(24) PO TABS: 1.0000 | ORAL_TABLET | Freq: Every day | ORAL | 0 refills | Status: DC

## 2022-05-06 NOTE — Progress Notes (Unsigned)
51 y.o. [id number] Married Caucasian female here for annual exam.    PCP:     No LMP recorded. Patient is perimenopausal.           Sexually active: {yes no:314532}  The current method of family planning is vasectomy.    Exercising: {yes no:314532}  {types:19826} Smoker:  no  Health Maintenance: Pap:  11-08-15 Neg:Neg HR HPV, 11-27-12 Neg:Neg HR HPV, 02-04-11 Neg History of abnormal Pap:  no MMG: ***11-16-21 Diag.Rt.w/US--benign cyst at 10 o'clock/Neg/BiRads2/screening 01/2022/ Colonoscopy:  ***2005 Neg;repeat age 39--Scheduled in June?? BMD:   n/a  Result  n/a TDaP:  PCP Gardasil:   no HIV: Neg in past Hep C: Unsure Screening Labs:  Hb today: ***, Urine today: ***   reports that she has never smoked. She has never used smokeless tobacco. She reports current alcohol use of about 2.0 standard drinks of alcohol per week. She reports that she does not use drugs.  Past Medical History:  Diagnosis Date   Bradycardia    Fibroids, subserous    1.4 cm.   Varicose veins    Vitiligo 2016    Past Surgical History:  Procedure Laterality Date   GANGLION CYST EXCISION     GANGLION CYST EXCISION Left    WISDOM TOOTH EXTRACTION      Current Outpatient Medications  Medication Sig Dispense Refill   ferrous sulfate 325 (65 FE) MG EC tablet Take 325 mg by mouth daily.     Multiple Vitamin (MULTIVITAMIN) tablet Take 1 tablet by mouth daily.     Norethindrone Acetate-Ethinyl Estrad-FE (LOESTRIN 24 FE) 1-20 MG-MCG(24) tablet Take 1 tablet by mouth daily. 84 tablet 0   No current facility-administered medications for this visit.    Family History  Problem Relation Age of Onset   Atrial fibrillation Mother 63   Breast cancer Maternal Grandmother        late 76s    Review of Systems  Exam:   There were no vitals taken for this visit.    General appearance: alert, cooperative and appears stated age Head: normocephalic, without obvious abnormality, atraumatic Neck: no adenopathy, supple,  symmetrical, trachea midline and thyroid normal to inspection and palpation Lungs: clear to auscultation bilaterally Breasts: normal appearance, no masses or tenderness, No nipple retraction or dimpling, No nipple discharge or bleeding, No axillary adenopathy Heart: regular rate and rhythm Abdomen: soft, non-tender; no masses, no organomegaly Extremities: extremities normal, atraumatic, no cyanosis or edema Skin: skin color, texture, turgor normal. No rashes or lesions Lymph nodes: cervical, supraclavicular, and axillary nodes normal. Neurologic: grossly normal  Pelvic: External genitalia:  no lesions              No abnormal inguinal nodes palpated.              Urethra:  normal appearing urethra with no masses, tenderness or lesions              Bartholins and Skenes: normal                 Vagina: normal appearing vagina with normal color and discharge, no lesions              Cervix: no lesions              Pap taken: {yes no:314532} Bimanual Exam:  Uterus:  normal size, contour, position, consistency, mobility, non-tender              Adnexa: no mass, fullness, tenderness  Rectal exam: {yes no:314532}.  Confirms.              Anus:  normal sphincter tone, no lesions  Chaperone was present for exam:  ***  Assessment:   Well woman visit with gynecologic exam.   Plan: Mammogram screening discussed. Self breast awareness reviewed. Pap and HR HPV as above. Guidelines for Calcium, Vitamin D, regular exercise program including cardiovascular and weight bearing exercise.   Follow up annually and prn.   Additional counseling given.  {yes [id number]. _______ minutes face to face time of which over 50% was spent in counseling.    After visit summary provided.

## 2022-05-07 ENCOUNTER — Encounter: Payer: Self-pay | Admitting: Obstetrics and Gynecology

## 2022-05-07 ENCOUNTER — Ambulatory Visit (INDEPENDENT_AMBULATORY_CARE_PROVIDER_SITE_OTHER): Payer: 59 | Admitting: Obstetrics and Gynecology

## 2022-05-07 VITALS — BP 118/68 | HR 69 | Ht 65.5 in | Wt 132.0 lb

## 2022-05-07 DIAGNOSIS — Z01419 Encounter for gynecological examination (general) (routine) without abnormal findings: Secondary | ICD-10-CM | POA: Diagnosis not present

## 2022-05-07 MED ORDER — NORETHIN ACE-ETH ESTRAD-FE 1-20 MG-MCG(24) PO TABS: 1.0000 | ORAL_TABLET | Freq: Every day | ORAL | 3 refills | Status: DC

## 2022-05-07 NOTE — Patient Instructions (Signed)

## 2022-08-05 ENCOUNTER — Telehealth: Payer: Self-pay | Admitting: *Deleted

## 2022-08-05 NOTE — Telephone Encounter (Signed)
She may want to take two pills a day for the next 5 days to try to stop the bleeding, skip the placebo pills, and go directly in to a new pack and have a cycle at the end of the next pack. This may help control her bleeding during the Holidays ahead.

## 2022-08-05 NOTE — Telephone Encounter (Signed)
Message to triage to contact patient.

## 2022-08-05 NOTE — Telephone Encounter (Signed)
Patient called takes Loestrin 62 FE takes daily, reports she had cycle on 07/15/22  that last 1 week, then she started bleeding again on 07/31/22 reports the bleeding as heavier over the weekend changing tampons every 2-3 hours for about 1 day, bleeding is light now, in the middle of pack. She asked does she continue to just take daily and skip placebo week which she will be due to take in about 10 days and start new pack?  Any other suggestions? Please advise

## 2022-08-06 NOTE — Telephone Encounter (Signed)
Left detailed message on voicemail per DPR access.

## 2022-08-08 ENCOUNTER — Ambulatory Visit: Payer: 59 | Admitting: Internal Medicine

## 2022-11-08 ENCOUNTER — Encounter: Payer: Self-pay | Admitting: Internal Medicine

## 2022-11-08 ENCOUNTER — Ambulatory Visit: Payer: 59 | Admitting: Internal Medicine

## 2022-11-08 VITALS — BP 114/60 | HR 65 | Temp 98.1°F | Ht 65.5 in | Wt 138.0 lb

## 2022-11-08 DIAGNOSIS — Z0000 Encounter for general adult medical examination without abnormal findings: Secondary | ICD-10-CM

## 2022-11-08 NOTE — Assessment & Plan Note (Signed)
Flu shot up to date. Covid-19 counseled. Shingrix complete getting records. Tetanus up to date getting records. Cologuard up to date getting records. Mammogram up to date, pap smear up to date. Counseled about sun safety and mole surveillance. Counseled about the dangers of distracted driving. Given 10 year screening recommendations.

## 2022-11-08 NOTE — Progress Notes (Signed)
   Subjective:   Patient ID: Alexis Watts, female    DOB: [birth year], 52 y.o.   MRN: [id number]  HPI The patient is a new 52 YO female coming in for physical.  PMH, [facility], social history reviewed and updated  Review of Systems  Constitutional: Negative.   HENT: Negative.    Eyes: Negative.   Respiratory:  Negative for cough, chest tightness and shortness of breath.   Cardiovascular:  Negative for chest pain, palpitations and leg swelling.  Gastrointestinal:  Negative for abdominal distention, abdominal pain, constipation, diarrhea, nausea and vomiting.  Musculoskeletal: Negative.   Skin: Negative.   Neurological: Negative.   Psychiatric/Behavioral: Negative.      Objective:  Physical Exam Constitutional:      Appearance: She is well-developed.  HENT:     Head: Normocephalic and atraumatic.  Cardiovascular:     Rate and Rhythm: Normal rate and regular rhythm.  Pulmonary:     Effort: Pulmonary effort is normal. No respiratory distress.     Breath sounds: Normal breath sounds. No wheezing or rales.  Abdominal:     General: Bowel sounds are normal. There is no distension.     Palpations: Abdomen is soft.     Tenderness: There is no abdominal tenderness. There is no rebound.  Musculoskeletal:     Cervical back: Normal range of motion.  Skin:    General: Skin is warm and dry.  Neurological:     Mental Status: She is alert and oriented to person, place, and time.     Coordination: Coordination normal.     Vitals:   11/08/22 1451  BP: 114/60  Pulse: 65  Temp: 98.1 F (36.7 C)  TempSrc: Oral  SpO2: 99%  Weight: 138 lb (62.6 kg)  Height: 5' 5.5" (1.664 m)    Assessment & Plan:

## 2022-12-26 ENCOUNTER — Other Ambulatory Visit: Payer: Self-pay | Admitting: Obstetrics and Gynecology

## 2022-12-26 DIAGNOSIS — Z1231 Encounter for screening mammogram for malignant neoplasm of breast: Secondary | ICD-10-CM

## 2022-12-30 ENCOUNTER — Telehealth: Payer: Self-pay | Admitting: Internal Medicine

## 2022-12-30 NOTE — Telephone Encounter (Signed)
Patient has a form that needs to be filled out for work. She has to get a tb skin test for it, which has been scheduled for 01/08/2023. It also has a check list asking if she has any limitations for vision, hearing, lifting, or carrying. She would like to know if she needs to make an appointment or if it can be filled out from her last appointment on 11/08/2022. She would like a call back at [phone].

## 2022-12-30 NOTE — Telephone Encounter (Signed)
LVM letting patient know she can drop off the form to our office and would not need to make an separate appointment

## 2022-12-31 NOTE — Telephone Encounter (Signed)
Patient has dropped off form, it will be in the providers box up front.

## 2023-01-01 NOTE — Telephone Encounter (Signed)
Noted I have this in a folder when patient comes in for TB testing

## 2023-01-06 ENCOUNTER — Ambulatory Visit
Admission: RE | Admit: 2023-01-06 | Discharge: 2023-01-06 | Disposition: A | Discharge status: Home / Self Care | Payer: 59 | Source: Ambulatory Visit | Attending: Obstetrics and Gynecology | Admitting: Obstetrics and Gynecology

## 2023-01-06 DIAGNOSIS — Z1231 Encounter for screening mammogram for malignant neoplasm of breast: Secondary | ICD-10-CM

## 2023-01-08 ENCOUNTER — Ambulatory Visit (INDEPENDENT_AMBULATORY_CARE_PROVIDER_SITE_OTHER): Payer: 59

## 2023-01-08 DIAGNOSIS — Z111 Encounter for screening for respiratory tuberculosis: Secondary | ICD-10-CM | POA: Diagnosis not present

## 2023-01-08 NOTE — Progress Notes (Addendum)
Pt was seen for a PPD skin test placement. Will be back for read on 01/10/23.   **Pt came in today to get PPD skin test read. Result is negative on 01/10/2023.

## 2023-01-10 ENCOUNTER — Ambulatory Visit: Payer: 59

## 2023-01-10 LAB — TB SKIN TEST
Induration: 0 mm
TB Skin Test: NEGATIVE

## 2023-01-10 NOTE — Progress Notes (Signed)
Pt came in today to get her PPD read. Result was negative.

## 2023-01-27 ENCOUNTER — Encounter: Payer: Self-pay | Admitting: Internal Medicine

## 2023-01-27 ENCOUNTER — Ambulatory Visit: Payer: 59 | Admitting: Internal Medicine

## 2023-01-27 VITALS — BP 112/80 | HR 64 | Temp 97.8°F | Ht 65.5 in | Wt 135.0 lb

## 2023-01-27 DIAGNOSIS — G4489 Other headache syndrome: Secondary | ICD-10-CM | POA: Diagnosis not present

## 2023-01-27 DIAGNOSIS — R519 Headache, unspecified: Secondary | ICD-10-CM | POA: Insufficient documentation

## 2023-01-27 MED ORDER — UBRELVY 50 MG PO TABS
50.0000 mg | ORAL_TABLET | Freq: Every day | ORAL | 2 refills | Status: DC | PRN
Start: 2023-01-27 — End: 2024-04-12

## 2023-01-27 NOTE — Assessment & Plan Note (Signed)
Has had 2 headaches in the last 2 months and 1 lasted 2 days. This did coincide with menstrual cycle. She is perimenopausal and BP normal. Suspect hormonally mediated. Talked about getting enough sleep, avoiding artifical sweeteners and using mindfulness to help with stress. Okay to use tylenol or ibuprofen for headaches. Rx ubrelvy 50 mg daily prn to use. If more headaches or more persistent she will let us know. No indication for further testing at this time.

## 2023-01-27 NOTE — Progress Notes (Signed)
   Subjective:   Patient ID: Alexis Watts, female    DOB: 1970/09/09, 52 y.o.   MRN: [id number]  HPI The patient is a 51 YO female coming in for headaches. New and not many prior headaches. Has had 2 in the last 2 months.   Review of Systems  Constitutional: Negative.   HENT: Negative.    Eyes: Negative.   Respiratory:  Negative for cough, chest tightness and shortness of breath.   Cardiovascular:  Negative for chest pain, palpitations and leg swelling.  Gastrointestinal:  Negative for abdominal distention, abdominal pain, constipation, diarrhea, nausea and vomiting.  Musculoskeletal: Negative.   Skin: Negative.   Neurological:  Positive for headaches.  Psychiatric/Behavioral: Negative.      Objective:  Physical Exam Constitutional:      Appearance: She is well-developed.  HENT:     Head: Normocephalic and atraumatic.  Cardiovascular:     Rate and Rhythm: Normal rate and regular rhythm.  Pulmonary:     Effort: Pulmonary effort is normal. No respiratory distress.     Breath sounds: Normal breath sounds. No wheezing or rales.  Abdominal:     General: Bowel sounds are normal. There is no distension.     Palpations: Abdomen is soft.     Tenderness: There is no abdominal tenderness. There is no rebound.  Musculoskeletal:     Cervical back: Normal range of motion.  Skin:    General: Skin is warm and dry.  Neurological:     Mental Status: She is alert and oriented to person, place, and time.     Coordination: Coordination normal.     Vitals:   01/27/23 0804  BP: 112/80  Pulse: 64  Temp: 97.8 F (36.6 C)  TempSrc: Oral  SpO2: 99%  Weight: 135 lb (61.2 kg)  Height: 5' 5.5" (1.664 m)    Assessment & Plan:

## 2023-01-29 ENCOUNTER — Telehealth: Payer: Self-pay | Admitting: Internal Medicine

## 2023-01-29 NOTE — Telephone Encounter (Signed)
Patient called and states that it has been 3 years since her last cologaurd - she would like to do another cologuard test but if that is not possible she would like to have a colonostory ordered.  Please give patient a call and let her know which she needs to do.  Patient's number:  [phone]

## 2023-01-29 NOTE — Telephone Encounter (Signed)
We do not have record of prior cologuard would need to get those records to ensure we can order another.

## 2023-01-30 ENCOUNTER — Encounter: Payer: Self-pay | Admitting: Internal Medicine

## 2023-01-30 NOTE — Telephone Encounter (Signed)
Spoke with patient and she stated that she will upload the forms from her previous cologaurd through my chart so we can get them abstracted and scanned into her chart.

## 2023-02-19 ENCOUNTER — Encounter: Payer: Self-pay | Admitting: Internal Medicine

## 2023-03-17 ENCOUNTER — Telehealth: Payer: Self-pay

## 2023-03-17 NOTE — Telephone Encounter (Signed)
PA started via CovermyMeds for [facility]

## 2023-03-19 ENCOUNTER — Ambulatory Visit: Payer: 59 | Admitting: Internal Medicine

## 2023-03-19 ENCOUNTER — Encounter: Payer: Self-pay | Admitting: Internal Medicine

## 2023-03-19 VITALS — BP 122/64 | HR 60 | Temp 98.6°F | Resp 16 | Ht 65.5 in | Wt 137.0 lb

## 2023-03-19 DIAGNOSIS — J0100 Acute maxillary sinusitis, unspecified: Secondary | ICD-10-CM

## 2023-03-19 MED ORDER — AMOXICILLIN-POT CLAVULANATE ER 1000-62.5 MG PO TB12
2.0000 | ORAL_TABLET | Freq: Two times a day (BID) | ORAL | 0 refills | Status: AC
Start: 2023-03-19 — End: 2023-03-29

## 2023-03-19 NOTE — Progress Notes (Signed)
Subjective:  Patient ID: Alexis Watts, female    DOB: 03-Oct-1970  Age: 52 y.o. MRN: [id number]  CC: Sinusitis   HPI Alexis Watts presents for f/up ---  Discussed the use of AI scribe software for clinical note transcription with the patient, who gave verbal consent to proceed.  History of Present Illness   The patient presents with a persistent sinus infection that began in mid-June. She reports a cycle of improvement followed by a return of symptoms, particularly when resuming normal activities such as running. The primary symptoms include a sinus pain, pressure in the ears, and postnasal drip. The patient denies significant nasal congestion but notes a sensation of deeper congestion and a 'drippy' feeling in the ears.  The patient has not sought prior treatment for this episode, typically managing these recurrent infections with over-the-counter sinus medication and NSAIDs when headaches become severe. She reports that these medications provide some relief from the pressure and headache.  The patient also notes a recent onset of menopausal symptoms, including hot flashes and feeling 'out of sorts,' which she believes may be contributing to her sinus symptoms. She denies fever or chills currently, but recall experiencing them at the onset of the infection in June.  The patient also reports mild sore throat and fatigue but denies any chest pain or wheezing. She has not noticed swollen lymph nodes. The patient denies any history of allergies.  The patient's symptoms have persisted for over four weeks, causing significant disruption to her daily activities and prompting her to seek treatment.       Outpatient Medications Prior to Visit  Medication Sig Dispense Refill   Multiple Vitamin (MULTIVITAMIN) tablet Take 1 tablet by mouth daily.     Norethindrone Acetate-Ethinyl Estrad-FE (LOESTRIN 24 FE) 1-20 MG-MCG(24) tablet Take 1 tablet by mouth daily. 84 tablet 3   Ubrogepant (UBRELVY) 50  MG TABS Take 1 tablet (50 mg total) by mouth daily as needed. 30 tablet 2   No facility-administered medications prior to visit.    ROS Review of Systems  Constitutional:  Negative for appetite change, chills, diaphoresis, fatigue, fever and unexpected weight change.  HENT:  Positive for ear pain, postnasal drip, rhinorrhea, sinus pressure and sinus pain. Negative for congestion, facial swelling, sneezing, sore throat and voice change.   Eyes: Negative.   Respiratory:  Positive for cough. Negative for chest tightness, shortness of breath and wheezing.   Cardiovascular:  Negative for chest pain, palpitations and leg swelling.  Gastrointestinal:  Negative for abdominal pain, constipation, diarrhea, nausea and vomiting.  Endocrine: Negative.   Genitourinary: Negative.  Negative for difficulty urinating.  Musculoskeletal: Negative.   Skin: Negative.  Negative for color change and rash.  Neurological: Negative.   Hematological:  Negative for adenopathy. Does not bruise/bleed easily.    Objective:  BP 122/64 (BP Location: Left Arm, Patient Position: Sitting, Cuff Size: Large)   Pulse 60   Temp 98.6 F (37 C) (Oral)   Resp 16   Ht 5' 5.5" (1.664 m)   Wt 137 lb (62.1 kg)   SpO2 99%   BMI 22.45 kg/m   BP Readings from Last 3 Encounters:  03/19/23 122/64  01/27/23 112/80  11/08/22 114/60    Wt Readings from Last 3 Encounters:  03/19/23 137 lb (62.1 kg)  01/27/23 135 lb (61.2 kg)  11/08/22 138 lb (62.6 kg)    Physical Exam Vitals reviewed.  Constitutional:      Appearance: Normal appearance.  HENT:  Right Ear: Hearing, tympanic membrane, ear canal and external ear normal.     Left Ear: Hearing, tympanic membrane, ear canal and external ear normal.     Nose: No nasal tenderness, mucosal edema, congestion or rhinorrhea.     Right Nostril: No epistaxis.     Left Nostril: No epistaxis.     Right Sinus: No maxillary sinus tenderness or frontal sinus tenderness.     Left  Sinus: No maxillary sinus tenderness or frontal sinus tenderness.     Mouth/Throat:     Mouth: Mucous membranes are moist.  Eyes:     General: No scleral icterus.    Conjunctiva/sclera: Conjunctivae normal.  Cardiovascular:     Rate and Rhythm: Normal rate and regular rhythm.     Heart sounds: No murmur heard.    No friction rub. No gallop.  Pulmonary:     Effort: Pulmonary effort is normal.     Breath sounds: No stridor. No wheezing, rhonchi or rales.  Abdominal:     General: Abdomen is flat.     Palpations: There is no mass.     Tenderness: There is no abdominal tenderness. There is no guarding.     Hernia: No hernia is present.  Musculoskeletal:        General: Normal range of motion.     Cervical back: Neck supple.     Right lower leg: No edema.     Left lower leg: No edema.  Lymphadenopathy:     Cervical: No cervical adenopathy.  Skin:    General: Skin is warm and dry.  Neurological:     General: No focal deficit present.     Mental Status: She is alert. Mental status is at baseline.  Psychiatric:        Mood and Affect: Mood normal.        Behavior: Behavior normal.     Lab Results  Component Value Date   WBC 5.1 11/05/2021   HGB 12.3 11/05/2021   HCT 38.3 11/05/2021   PLT 222 11/05/2021   GLUCOSE 82 12/15/2019   CHOL 157 12/15/2019   TRIG 67 12/15/2019   HDL 64 12/15/2019   LDLCALC 80 12/15/2019   ALT 15 12/15/2019   AST 19 12/15/2019   NA 141 12/15/2019   K 3.9 12/15/2019   CL 103 12/15/2019   CREATININE 0.69 12/15/2019   BUN 12 12/15/2019   CO2 25 12/15/2019    MM 3D SCREENING MAMMOGRAM BILATERAL BREAST  Result Date: 01/08/2023 CLINICAL DATA:  Screening. EXAM: DIGITAL SCREENING BILATERAL MAMMOGRAM WITH TOMOSYNTHESIS AND CAD TECHNIQUE: Bilateral screening digital craniocaudal and mediolateral oblique mammograms were obtained. Bilateral screening digital breast tomosynthesis was performed. The images were evaluated with computer-aided detection.  COMPARISON:  Previous exam(s). ACR Breast Density Category c: The breasts are heterogeneously dense, which may obscure small masses. FINDINGS: There are no findings suspicious for malignancy. IMPRESSION: No mammographic evidence of malignancy. A result letter of this screening mammogram will be mailed directly to the patient. RECOMMENDATION: Screening mammogram in one year. (Code:SM-B-01Y) BI-RADS CATEGORY  1: Negative. Electronically Signed   By: Sande Brothers M.D.   On: 01/08/2023 10:41    Assessment & Plan:   Acute non-recurrent maxillary sinusitis -     Amoxicillin-Pot Clavulanate ER; Take 2 tablets by mouth 2 (two) times daily for 10 days.  Dispense: 40 tablet; Refill: 0     Follow-up: Return in about 3 weeks (around 04/09/2023).  Sanda Linger, MD

## 2023-03-19 NOTE — Patient Instructions (Signed)

## 2023-03-27 NOTE — Telephone Encounter (Signed)
PA approved.

## 2023-04-01 ENCOUNTER — Ambulatory Visit: Payer: 59 | Admitting: Internal Medicine

## 2023-05-13 ENCOUNTER — Other Ambulatory Visit: Payer: Self-pay

## 2023-05-13 DIAGNOSIS — N921 Excessive and frequent menstruation with irregular cycle: Secondary | ICD-10-CM

## 2023-05-13 MED ORDER — NORETHIN ACE-ETH ESTRAD-FE 1-20 MG-MCG(24) PO TABS
1.0000 | ORAL_TABLET | Freq: Every day | ORAL | 0 refills | Status: DC
Start: 2023-05-13 — End: 2023-08-04

## 2023-05-13 NOTE — Telephone Encounter (Signed)
Med refill request: Hailey 24 FE Last AEX: 05/07/22 Next AEX: none scheduled Last MMG (if hormonal med) 01/06/23 BI-RADS 1 Refill sent to provider for approval.

## 2023-05-16 NOTE — Telephone Encounter (Signed)
VM left for patient to call back regarding refill for Hailey to see if she needed prescription refill prior to Dr. Edward Jolly returning to the office.

## 2023-08-04 ENCOUNTER — Other Ambulatory Visit: Payer: Self-pay

## 2023-08-04 DIAGNOSIS — N921 Excessive and frequent menstruation with irregular cycle: Secondary | ICD-10-CM

## 2023-08-04 MED ORDER — NORETHIN ACE-ETH ESTRAD-FE 1-20 MG-MCG(24) PO TABS
1.0000 | ORAL_TABLET | Freq: Every day | ORAL | 0 refills | Status: DC
Start: 2023-08-04 — End: 2024-02-17

## 2023-08-04 NOTE — Telephone Encounter (Signed)
Medication refill request: hailey 24 fe 1/20 Last AEX:  05-07-22 Next AEX: not scheduled Last MMG (if hormonal medication request): message sent to scheduling department to call to schedule aex Refill authorized: please approve or deny as appropriate

## 2023-11-07 ENCOUNTER — Telehealth: Payer: Self-pay

## 2023-11-07 ENCOUNTER — Encounter: Payer: Self-pay | Admitting: Internal Medicine

## 2023-11-07 NOTE — Telephone Encounter (Signed)
 Called patient and to make aware need to cancel appointment this morning due to provider family emergency.

## 2024-01-15 ENCOUNTER — Encounter: Admitting: Internal Medicine

## 2024-01-16 ENCOUNTER — Ambulatory Visit (INDEPENDENT_AMBULATORY_CARE_PROVIDER_SITE_OTHER): Admitting: Internal Medicine

## 2024-01-16 ENCOUNTER — Encounter: Payer: Self-pay | Admitting: Internal Medicine

## 2024-01-16 VITALS — BP 112/80 | HR 49 | Temp 97.8°F | Ht 65.5 in | Wt 136.0 lb

## 2024-01-16 DIAGNOSIS — Z136 Encounter for screening for cardiovascular disorders: Secondary | ICD-10-CM

## 2024-01-16 DIAGNOSIS — Z1211 Encounter for screening for malignant neoplasm of colon: Secondary | ICD-10-CM

## 2024-01-16 DIAGNOSIS — Z0000 Encounter for general adult medical examination without abnormal findings: Secondary | ICD-10-CM | POA: Diagnosis not present

## 2024-01-16 LAB — COMPREHENSIVE METABOLIC PANEL WITH GFR
ALT: 15 U/L (ref 0–35)
AST: 19 U/L (ref 0–37)
Albumin: 4.2 g/dL (ref 3.5–5.2)
Alkaline Phosphatase: 70 U/L (ref 39–117)
BUN: 16 mg/dL (ref 6–23)
CO2: 29 meq/L (ref 19–32)
Calcium: 9.5 mg/dL (ref 8.4–10.5)
Chloride: 105 meq/L (ref 96–112)
Creatinine, Ser: 0.77 mg/dL (ref 0.40–1.20)
GFR: 87.59 mL/min (ref >60.00)
Glucose, Bld: 92 mg/dL (ref 70–99)
Potassium: 4.1 meq/L (ref 3.5–5.1)
Sodium: 141 meq/L (ref 135–145)
Total Bilirubin: 0.5 mg/dL (ref 0.2–1.2)
Total Protein: 7.1 g/dL (ref 6.0–8.3)

## 2024-01-16 LAB — CBC
HCT: 38.3 % (ref 36.0–46.0)
Hemoglobin: 12.5 g/dL (ref 12.0–15.0)
MCHC: 32.6 g/dL (ref 30.0–36.0)
MCV: 82.7 fl (ref 78.0–100.0)
Platelets: 186 10*3/uL (ref 150.0–400.0)
RBC: 4.63 Mil/uL (ref 3.87–5.11)
RDW: 14.2 % (ref 11.5–15.5)
WBC: 3.6 10*3/uL — ABNORMAL LOW (ref 4.0–10.5)

## 2024-01-16 LAB — LIPID PANEL
Cholesterol: 163 mg/dL (ref 0–200)
HDL: 66.5 mg/dL (ref >39.00)
LDL Cholesterol: 84 mg/dL (ref 0–99)
NonHDL: 96.41
Total CHOL/HDL Ratio: 2
Triglycerides: 64 mg/dL (ref 0.0–149.0)
VLDL: 12.8 mg/dL (ref 0.0–40.0)

## 2024-01-16 LAB — VITAMIN D 25 HYDROXY (VIT D DEFICIENCY, FRACTURES): VITD: 23.75 ng/mL — ABNORMAL LOW (ref 30.00–100.00)

## 2024-01-16 NOTE — Progress Notes (Signed)
   Subjective:   Patient ID: Alexis Watts, female    DOB: April 19, 1971, 53 y.o.   MRN: [id number]  HPI The patient is here for physical.  PMH, [facility], social history reviewed and updated  Review of Systems  Constitutional: Negative.   HENT: Negative.    Eyes: Negative.   Respiratory:  Negative for cough, chest tightness and shortness of breath.   Cardiovascular:  Negative for chest pain, palpitations and leg swelling.  Gastrointestinal:  Negative for abdominal distention, abdominal pain, constipation, diarrhea, nausea and vomiting.  Musculoskeletal: Negative.   Skin: Negative.   Neurological: Negative.   Psychiatric/Behavioral: Negative.      Objective:  Physical Exam Constitutional:      Appearance: She is well-developed.  HENT:     Head: Normocephalic and atraumatic.  Cardiovascular:     Rate and Rhythm: Normal rate and regular rhythm.  Pulmonary:     Effort: Pulmonary effort is normal. No respiratory distress.     Breath sounds: Normal breath sounds. No wheezing or rales.  Abdominal:     General: Bowel sounds are normal. There is no distension.     Palpations: Abdomen is soft.     Tenderness: There is no abdominal tenderness. There is no rebound.  Musculoskeletal:     Cervical back: Normal range of motion.  Skin:    General: Skin is warm and dry.  Neurological:     Mental Status: She is alert and oriented to person, place, and time.     Coordination: Coordination normal.     Vitals:   01/16/24 0835  BP: 112/80  Pulse: (!) 49  Temp: 97.8 F (36.6 C)  TempSrc: Oral  SpO2: 99%  Weight: 136 lb (61.7 kg)  Height: 5' 5.5" (1.664 m)    Assessment & Plan:

## 2024-01-16 NOTE — Assessment & Plan Note (Signed)
 Flu shot yearly. Pneumonia counseled. Shingrix complete. Tetanus up to date. Colonoscopy up to date. Mammogram upto date, pap smear up to date. Counseled about sun safety and mole surveillance. Counseled about the dangers of distracted driving. Given 10 year screening recommendations.

## 2024-01-19 ENCOUNTER — Ambulatory Visit: Payer: Self-pay | Admitting: Internal Medicine

## 2024-01-28 LAB — COLOGUARD: COLOGUARD: NEGATIVE

## 2024-02-16 NOTE — Progress Notes (Unsigned)
 53 y.o. G21P3003 Married Caucasian female here for annual exam.    PCP: Rollene Almarie LABOR, MD   No LMP recorded. Patient is perimenopausal.           Sexually active: Yes.    The current method of family planning is vasectomy.    Menopausal hormone therapy:  n/a Exercising: {yes no:314532}  {types:19826} Smoker:  no  OB History  Gravida Para Term Preterm AB Living  3 3 3   3   SAB IAB Ectopic Multiple Live Births          # Outcome Date GA Lbr Len/2nd Weight Sex Type Anes PTL Lv  3 Term           2 Term           1 Term              HEALTH MAINTENANCE: Last 2 paps:  12/19/20 neg HR HPV neg, 02/03/21 neg  History of abnormal Pap or positive HPV:  no Mammogram:   01/06/23 Breast Density Cat C, BIRADS Cat 1 neg  Colonoscopy:  n/a Bone Density:  n/a  Result  n/a   Immunization History  Administered Date(s) Administered   Influenza,inj,Quad PF,6+ Mos 06/11/2022   PFIZER(Purple Top)SARS-COV-2 Vaccination 10/16/2019, 11/06/2019   PPD Test 10/25/2011, 01/08/2023   Pfizer Covid-19 Vaccine Bivalent Booster 64yrs & up 06/04/2022      reports that she has never smoked. She has never used smokeless tobacco. She reports current alcohol use of about 2.0 standard drinks of alcohol per week. She reports that she does not use drugs.  Past Medical History:  Diagnosis Date   Bradycardia    Fibroids, subserous    1.4 cm.   Varicose veins    Vitiligo 2016    Past Surgical History:  Procedure Laterality Date   GANGLION CYST EXCISION     GANGLION CYST EXCISION Left    WISDOM TOOTH EXTRACTION      Current Outpatient Medications  Medication Sig Dispense Refill   Multiple Vitamin (MULTIVITAMIN) tablet Take 1 tablet by mouth daily.     Norethindrone Acetate-Ethinyl Estrad-FE (LOESTRIN  24 FE) 1-20 MG-MCG(24) tablet Take 1 tablet by mouth daily. (Patient not taking: Reported on 01/16/2024) 84 tablet 0   Ubrogepant  (UBRELVY ) 50 MG TABS Take 1 tablet (50 mg total) by mouth daily as  needed. (Patient not taking: Reported on 01/16/2024) 30 tablet 2   No current facility-administered medications for this visit.    ALLERGIES: Patient has no known allergies.  Family History  Problem Relation Age of Onset   Atrial fibrillation Mother 20   Breast cancer Maternal Grandmother        late 73s    Review of Systems  PHYSICAL EXAM:  There were no vitals taken for this visit.    General appearance: alert, cooperative and appears stated age Head: normocephalic, without obvious abnormality, atraumatic Neck: no adenopathy, supple, symmetrical, trachea midline and thyroid normal to inspection and palpation Lungs: clear to auscultation bilaterally Breasts: normal appearance, no masses or tenderness, No nipple retraction or dimpling, No nipple discharge or bleeding, No axillary adenopathy Heart: regular rate and rhythm Abdomen: soft, non-tender; no masses, no organomegaly Extremities: extremities normal, atraumatic, no cyanosis or edema Skin: skin color, texture, turgor normal. No rashes or lesions Lymph nodes: cervical, supraclavicular, and axillary nodes normal. Neurologic: grossly normal  Pelvic: External genitalia:  no lesions              No abnormal  inguinal nodes palpated.              Urethra:  normal appearing urethra with no masses, tenderness or lesions              Bartholins and Skenes: normal                 Vagina: normal appearing vagina with normal color and discharge, no lesions              Cervix: no lesions              Pap taken: {yes no:314532} Bimanual Exam:  Uterus:  normal size, contour, position, consistency, mobility, non-tender              Adnexa: no mass, fullness, tenderness              Rectal exam: {yes no:314532}.  Confirms.              Anus:  normal sphincter tone, no lesions  Chaperone was present for exam:  {BSCHAPERONE:31226::Emily F, CMA}  ASSESSMENT: Well woman visit with gynecologic exam.  PHQ-2-9:  ***  ***  PLAN: Mammogram screening discussed. Self breast awareness reviewed. Pap and HRV collected:  {yes no:314532} Guidelines for Calcium , Vitamin D , regular exercise program including cardiovascular and weight bearing exercise. Medication refills:  *** {LABS (Optional):23779} Follow up:  ***    Additional counseling given.  {yes [id number]. ***  total time was spent for this patient encounter, including preparation, face-to-face counseling with the patient, coordination of care, and documentation of the encounter in addition to doing the well woman visit with gynecologic exam.

## 2024-02-17 ENCOUNTER — Ambulatory Visit (INDEPENDENT_AMBULATORY_CARE_PROVIDER_SITE_OTHER): Admitting: Obstetrics and Gynecology

## 2024-02-17 ENCOUNTER — Encounter: Payer: Self-pay | Admitting: Obstetrics and Gynecology

## 2024-02-17 VITALS — BP 112/64 | HR 53 | Ht 67.0 in | Wt 137.0 lb

## 2024-02-17 DIAGNOSIS — Z01419 Encounter for gynecological examination (general) (routine) without abnormal findings: Secondary | ICD-10-CM

## 2024-02-17 DIAGNOSIS — Z1331 Encounter for screening for depression: Secondary | ICD-10-CM

## 2024-02-17 NOTE — Patient Instructions (Signed)

## 2024-03-15 ENCOUNTER — Other Ambulatory Visit: Payer: Self-pay | Admitting: Obstetrics and Gynecology

## 2024-03-15 ENCOUNTER — Ambulatory Visit
Admission: RE | Admit: 2024-03-15 | Discharge: 2024-03-15 | Disposition: A | Discharge status: Home / Self Care | Source: Ambulatory Visit | Attending: Obstetrics and Gynecology | Admitting: Obstetrics and Gynecology

## 2024-03-15 DIAGNOSIS — Z1231 Encounter for screening mammogram for malignant neoplasm of breast: Secondary | ICD-10-CM

## 2024-03-18 ENCOUNTER — Other Ambulatory Visit: Payer: Self-pay | Admitting: Obstetrics and Gynecology

## 2024-03-18 ENCOUNTER — Ambulatory Visit: Payer: Self-pay | Admitting: Obstetrics and Gynecology

## 2024-03-18 DIAGNOSIS — R928 Other abnormal and inconclusive findings on diagnostic imaging of breast: Secondary | ICD-10-CM

## 2024-03-26 ENCOUNTER — Ambulatory Visit
Admission: RE | Admit: 2024-03-26 | Discharge: 2024-03-26 | Disposition: A | Discharge status: Home / Self Care | Source: Ambulatory Visit | Attending: Obstetrics and Gynecology | Admitting: Obstetrics and Gynecology

## 2024-03-26 ENCOUNTER — Ambulatory Visit

## 2024-03-26 DIAGNOSIS — R928 Other abnormal and inconclusive findings on diagnostic imaging of breast: Secondary | ICD-10-CM

## 2024-03-27 ENCOUNTER — Ambulatory Visit: Payer: Self-pay | Admitting: Obstetrics and Gynecology

## 2024-04-11 ENCOUNTER — Encounter: Payer: Self-pay | Admitting: Internal Medicine

## 2024-04-12 MED ORDER — UBRELVY 50 MG PO TABS: 50.0000 mg | ORAL_TABLET | Freq: Every day | ORAL | 2 refills | Status: AC | PRN

## 2024-04-12 NOTE — Telephone Encounter (Signed)
 Refill pended - was seen 01/16/2024.

## 2024-04-16 ENCOUNTER — Other Ambulatory Visit (HOSPITAL_COMMUNITY): Payer: Self-pay

## 2024-04-16 ENCOUNTER — Telehealth: Payer: Self-pay

## 2024-04-16 NOTE — Telephone Encounter (Signed)
 Pharmacy Patient Advocate Encounter  Received notification from [facility] that Prior Authorization for Ubrelvy  50 has been DENIED.  Full denial letter will be uploaded to the media tab. See denial reason below.       PA #/Case ID/Reference #: # [id number]

## 2024-04-16 NOTE — Telephone Encounter (Signed)
 Pharmacy Patient Advocate Encounter   Received notification from Onbase that prior authorization for Ubrelvy  50 is required/requested.   Insurance verification completed.   The patient is insured through [facility] .   Per test claim: PA required; PA submitted to above mentioned insurance via Latent Key/confirmation #/EOC [id number] Status is pending

## 2024-04-22 NOTE — Telephone Encounter (Signed)
 Copied from CRM [id number]. Topic: Referral - Prior Authorization Question >> Apr 22, 2024  3:08 PM Robinson H wrote: Reason for CRM: Patient is following up on her prior authorization denial for the Ubrogepant  (UBRELVY ) 50 MG TABS, wants more information on why it was denied and next steps.  Angellynn [phone]

## 2024-05-07 ENCOUNTER — Ambulatory Visit: Payer: Self-pay

## 2024-05-07 NOTE — Telephone Encounter (Signed)
 FYI Only or Action Required?: FYI only for provider.  Patient was last seen in primary care on 01/16/2024 by Rollene Almarie LABOR, MD.  Called Nurse Triage reporting Migraine.  Symptoms began about a month ago.  Interventions attempted: OTC medications: Tylenol, ibuprofen.  Symptoms are: migraine headaches about once weekly, hormone imbalance, night sweats and hot flashes, hypertension (recent reading 140/80-90) stable.  Triage Disposition: See PCP Within 2 Weeks  Patient/caregiver understands and will follow disposition?: Yes                 Copied from CRM [id number]. Topic: Clinical - Red Word Triage >> May 07, 2024 12:53 PM Pinkey ORN wrote: Red Word that prompted transfer to Nurse Triage: Worsening Migraines >> May 07, 2024 12:54 PM Pinkey ORN wrote: Patient states she's been experiencing some worsening migraines, also mentioned concerns of possible high blood pressure.  Reason for Disposition  Headache is a chronic symptom (recurrent or ongoing AND present > 4 weeks)  Answer Assessment - Initial Assessment Questions 1. LOCATION: Where does it hurt?      Behind eyes. She states when it comes on her whole head and neck will start to hurt as well.  2. ONSET: When did the headache start? 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  3. PATTERN: Does the pain come and go, or has it been constant since it started?     Comes and goes, about once per week. She states it will fully resolve and then come back.  4. SEVERITY: How bad is the pain? and What does it keep you from doing?  (e.g., Scale 1-10; mild, moderate, or severe)     No migraine pain right now. She states when she does get a migraine she can't look at the computer, can't sleep and has a hard time doing most daily activities.  5. RECURRENT SYMPTOM: Have you ever had headaches before? If Yes, ask: When was  the last time? and What happened that time?      Yes. She states she had been going months without any migraines. She was previously treated with Ubrelvy .  6. CAUSE: What do you think is causing the headache?     She thinks this could be related to menopause and hormone imbalance.  7. MIGRAINE: Have you been diagnosed with migraine headaches? If Yes, ask: Is this headache similar?      Yes, she previously took Ubrelvy . She states this feels similar like her migraines but has not been coming as close together. She is calling in because she wants to address this before they become more frequent.  8. HEAD INJURY: Has there been any recent injury to your head?      No.  9. OTHER SYMPTOMS: Do you have any other symptoms? (e.g., fever, stiff neck, eye pain, sore throat, cold symptoms)     High blood pressure x 1 month (checks it at the [facility]; most recent reading 140/80-90); night sweats; hot flashes. Denies chest pain, SOB, unilateral numbness or weakness, changes in speech or vision, unsteady gait, neck stiffness.  10. PREGNANCY: Is there any chance you are pregnant? When was your last menstrual period?       No. She states she is in menopause, over 12 months since her last menstrual period.  Protocols used: [facility]

## 2024-05-17 ENCOUNTER — Encounter: Payer: Self-pay | Admitting: Internal Medicine

## 2024-05-17 ENCOUNTER — Ambulatory Visit: Admitting: Internal Medicine

## 2024-05-17 VITALS — BP 112/80 | HR 53 | Temp 98.2°F | Ht 67.0 in | Wt 138.0 lb

## 2024-05-17 DIAGNOSIS — G43009 Migraine without aura, not intractable, without status migrainosus: Secondary | ICD-10-CM | POA: Diagnosis not present

## 2024-05-17 DIAGNOSIS — R001 Bradycardia, unspecified: Secondary | ICD-10-CM

## 2024-05-17 DIAGNOSIS — G43909 Migraine, unspecified, not intractable, without status migrainosus: Secondary | ICD-10-CM | POA: Insufficient documentation

## 2024-05-17 MED ORDER — NURTEC 75 MG PO TBDP: 75.0000 mg | ORAL_TABLET | Freq: Every day | ORAL | 3 refills | Status: AC | PRN

## 2024-05-17 NOTE — Progress Notes (Signed)
 "  Subjective:   Patient ID: Alexis Watts, female    DOB: 01-Sep-1970, 53 y.o.   MRN: [id number]  Discussed the use of AI scribe software for clinical note transcription with the patient, who gave verbal consent to proceed.  History of Present Illness Alexis Watts is a 53 year old female who presents with recurrent migraines.  She has experienced an increase in migraine frequency, which she associates with stress, particularly since the start of the school year. Over the summer, she had almost no migraines. 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  She uses Excedrin Migraine, which is effective but causes her heart rate to increase due to its caffeine content. She also uses Motrin, requiring up to 800 mg for relief, which she finds excessive. She previously used a prescribed medication ubrelvy  that was effective but is currently without it due to insurance changes.  She is in the menopausal transition, having gone a year without a period, and suspects her migraines may be hormonally related, as she never experienced headaches before this transition. She has not identified any specific food triggers and has not kept a headache diary.  She is concerned about her blood pressure, which has been variable. This concern was heightened after a friend of similar age and fitness level had a heart attack. No chest pain or other symptoms associated with high blood pressure.  Review of Systems  Constitutional: Negative.   HENT: Negative.    Eyes: Negative.   Respiratory:  Negative for cough, chest tightness and shortness of breath.   Cardiovascular:  Negative for chest pain, palpitations and leg swelling.  Gastrointestinal:  Negative for abdominal distention, abdominal pain, constipation, diarrhea, nausea and vomiting.  Musculoskeletal: Negative.   Skin: Negative.   Neurological:  Positive for headaches.  Psychiatric/Behavioral:  Negative.      Objective:  Physical Exam Constitutional:      Appearance: She is well-developed.  HENT:     Head: Normocephalic and atraumatic.  Cardiovascular:     Rate and Rhythm: Normal rate and regular rhythm.  Pulmonary:     Effort: Pulmonary effort is normal. No respiratory distress.     Breath sounds: Normal breath sounds. No wheezing or rales.  Abdominal:     General: Bowel sounds are normal. There is no distension.     Palpations: Abdomen is soft.     Tenderness: There is no abdominal tenderness.  Musculoskeletal:        General: Tenderness present.     Cervical back: Normal range of motion.  Skin:    General: Skin is warm and dry.  Neurological:     Mental Status: She is alert and oriented to person, place, and time.     Coordination: Coordination normal.     Vitals:   05/17/24 1529  BP: 112/80  Pulse: (!) 53  Temp: 98.2 F (36.8 C)  TempSrc: Oral  SpO2: 99%  Weight: 138 lb (62.6 kg)  Height: 5' 7 (1.702 m)    Assessment and Plan Assessment & Plan Migraine in menopausal state   Intermittent migraines are likely stress-related and possibly hormonal, with increased frequency. Previous treatment with Ubrelvy  was effective but is too costly. Current use of Excedrin Migraine and Motrin has limitations. Hormonal influence due to menopause is suspected, with an expected decrease in migraines post-menopause. Prescribe Nurtec for acute management and submit for insurance approval. Recommend a combination of 600 mg ibuprofen  and 500 mg acetaminophen for over-the-counter management. Discuss common triggers such as artificial sweeteners, MSG, alcohol, and peanuts. Suggest maintaining a headache diary to identify triggers.  Elevated blood pressure, intermittent   Intermittent elevated blood pressure readings have been noted, though the recent office reading was normal. There is concern due to a friend's recent heart attack. Discussed the potential for a calcium  score CT  to assess coronary artery plaque if blood pressure remains elevated or cardiovascular risk increases.   "

## 2024-05-17 NOTE — Patient Instructions (Addendum)
 We have sent in nurtec for migraines to use if needed.  If you want to do the calcium  score for the heart let us  know.

## 2024-05-18 ENCOUNTER — Other Ambulatory Visit (HOSPITAL_COMMUNITY): Payer: Self-pay

## 2024-05-18 ENCOUNTER — Telehealth: Payer: Self-pay

## 2024-05-18 NOTE — Telephone Encounter (Signed)
 Pharmacy Patient Advocate Encounter   Received notification from CoverMyMeds that prior authorization for Nurtec 75MG  dispersible tablets  is required/requested.   Insurance verification completed.   The patient is insured through [facility] .   Per test claim: PA required; PA submitted to above mentioned insurance via Latent Key/confirmation #/EOC [id number] Status is pending

## 2024-05-19 NOTE — Telephone Encounter (Signed)
 Prior Authorization form/request asks a question that requires your assistance. Please see the question below and advise accordingly. The PA will not be submitted until the necessary information is received.  PLEASE BE ADVISED NEED ASSISTANCE TO PROCEED WITH PA

## 2024-05-19 NOTE — Telephone Encounter (Signed)
**Note De-identified  [place] Obfuscation** Please advise 

## 2024-05-20 NOTE — Assessment & Plan Note (Signed)
 Intermittent elevated blood pressure readings have been noted, though the recent office reading was normal. There is concern due to a friend's recent heart attack. Discussed the potential for a calcium  score CT to assess coronary artery plaque if blood pressure remains elevated or cardiovascular risk increases.

## 2024-05-20 NOTE — Telephone Encounter (Signed)
 She is unable to take triptans

## 2024-05-20 NOTE — Assessment & Plan Note (Signed)
 Intermittent migraines are likely stress-related and possibly hormonal, with increased frequency. Previous treatment with Ubrelvy  was effective but is too costly. Current use of Excedrin Migraine and Motrin has limitations. Hormonal influence due to menopause is suspected, with an expected decrease in migraines post-menopause. Prescribe Nurtec for acute management and submit for insurance approval. Recommend a combination of 600 mg ibuprofen and 500 mg acetaminophen for over-the-counter management. Discuss common triggers such as artificial sweeteners, MSG, alcohol, and peanuts. Suggest maintaining a headache diary to identify triggers. She is unable to trial triptans.

## 2024-05-20 NOTE — Telephone Encounter (Signed)
 I do not understand the message below can the sender clarify?

## 2024-05-21 NOTE — Telephone Encounter (Signed)
 She is unable to take triptans as noted by me both in the phone note and in chart.

## 2024-05-24 ENCOUNTER — Other Ambulatory Visit (HOSPITAL_COMMUNITY): Payer: Self-pay

## 2024-05-24 NOTE — Telephone Encounter (Signed)
 Pharmacy Patient Advocate Encounter  Received notification from [facility] that Prior Authorization for Nurtec 75MG  dispersible tablets  has been APPROVED NO PA IS NEEDED AT THIS TIME PA IS ONLY ALLOW with QUANTITY LIMIT of 16 for a 30 day supply.   PA #/Case ID/Reference #: [id number]
